# Patient Record
Sex: Male | Born: 1965 | Race: Black or African American | Hispanic: No | Marital: Single | Smoking: Current every day smoker
Health system: Southern US, Community
[De-identification: ages and names within clinical notes are randomized; demographics above are authoritative.]

## PROBLEM LIST (undated history)

## (undated) HISTORY — PX: EXPLORATORY LAPAROTOMY: SUR591

---
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
## 2015-01-09 ENCOUNTER — Encounter (HOSPITAL_COMMUNITY): Payer: Self-pay | Admitting: Emergency Medicine

## 2015-01-09 ENCOUNTER — Emergency Department (HOSPITAL_COMMUNITY)
Admission: EM | Admit: 2015-01-09 | Discharge: 2015-01-10 | Disposition: A | Discharge status: Home / Self Care | Payer: BLUE CROSS/BLUE SHIELD | Attending: Emergency Medicine | Admitting: Emergency Medicine

## 2015-01-09 DIAGNOSIS — R1110 Vomiting, unspecified: Secondary | ICD-10-CM | POA: Diagnosis not present

## 2015-01-09 DIAGNOSIS — Z720 Tobacco use: Secondary | ICD-10-CM | POA: Diagnosis not present

## 2015-01-09 DIAGNOSIS — R109 Unspecified abdominal pain: Secondary | ICD-10-CM

## 2015-01-09 DIAGNOSIS — R112 Nausea with vomiting, unspecified: Secondary | ICD-10-CM | POA: Diagnosis not present

## 2015-01-09 DIAGNOSIS — R1084 Generalized abdominal pain: Secondary | ICD-10-CM | POA: Diagnosis not present

## 2015-01-09 NOTE — ED Notes (Signed)
Pt c/o generalized abd pain with vomiting and chills.

## 2015-01-10 LAB — CBC WITH DIFFERENTIAL/PLATELET
Basophils Absolute: 0 10*3/uL (ref 0.0–0.1)
Basophils Relative: 0 % (ref 0–1)
EOS ABS: 0.1 10*3/uL (ref 0.0–0.7)
Eosinophils Relative: 1 % (ref 0–5)
HEMATOCRIT: 44.1 % (ref 39.0–52.0)
Hemoglobin: 15.1 g/dL (ref 13.0–17.0)
Lymphocytes Relative: 18 % (ref 12–46)
Lymphs Abs: 1.8 10*3/uL (ref 0.7–4.0)
MCH: 32.7 pg (ref 26.0–34.0)
MCHC: 34.2 g/dL (ref 30.0–36.0)
MCV: 95.5 fL (ref 78.0–100.0)
MONO ABS: 0.6 10*3/uL (ref 0.1–1.0)
Monocytes Relative: 6 % (ref 3–12)
Neutro Abs: 7.6 10*3/uL (ref 1.7–7.7)
Neutrophils Relative %: 75 % (ref 43–77)
Platelets: 270 10*3/uL (ref 150–400)
RBC: 4.62 MIL/uL (ref 4.22–5.81)
RDW: 14.3 % (ref 11.5–15.5)
WBC: 10.2 10*3/uL (ref 4.0–10.5)

## 2015-01-10 LAB — COMPREHENSIVE METABOLIC PANEL
ALBUMIN: 4.6 g/dL (ref 3.5–5.2)
ALK PHOS: 73 U/L (ref 39–117)
ALT: 29 U/L (ref 0–53)
ANION GAP: 10 (ref 5–15)
AST: 37 U/L (ref 0–37)
BILIRUBIN TOTAL: 0.8 mg/dL (ref 0.3–1.2)
BUN: 9 mg/dL (ref 6–23)
CO2: 27 mmol/L (ref 19–32)
Calcium: 9.9 mg/dL (ref 8.4–10.5)
Chloride: 99 mmol/L (ref 96–112)
Creatinine, Ser: 1.07 mg/dL (ref 0.50–1.35)
GFR, EST NON AFRICAN AMERICAN: 82 mL/min — AB (ref >90)
GLUCOSE: 191 mg/dL — AB (ref 70–99)
POTASSIUM: 3.7 mmol/L (ref 3.5–5.1)
Sodium: 136 mmol/L (ref 135–145)
Total Protein: 9 g/dL — ABNORMAL HIGH (ref 6.0–8.3)

## 2015-01-10 LAB — LIPASE, BLOOD: LIPASE: 19 U/L (ref 11–59)

## 2015-01-10 MED ORDER — SODIUM CHLORIDE 0.9 % IV SOLN: 1000.0000 mL | INTRAVENOUS | Status: DC

## 2015-01-10 MED ORDER — SODIUM CHLORIDE 0.9 % IV SOLN
1000.0000 mL | Freq: Once | INTRAVENOUS | Status: AC
  Administered 2015-01-10: 1000 mL via INTRAVENOUS

## 2015-01-10 MED ORDER — ONDANSETRON HCL 4 MG/2ML IJ SOLN
4.0000 mg | Freq: Once | INTRAMUSCULAR | Status: AC
  Administered 2015-01-10: 4 mg via INTRAVENOUS
  Filled 2015-01-10: qty 2

## 2015-01-10 MED ORDER — FENTANYL CITRATE 0.05 MG/ML IJ SOLN
25.0000 ug | Freq: Once | INTRAMUSCULAR | Status: AC
  Administered 2015-01-10: 25 ug via INTRAVENOUS
  Filled 2015-01-10: qty 2

## 2015-01-10 MED ORDER — ONDANSETRON HCL 4 MG PO TABS: 4.0000 mg | ORAL_TABLET | Freq: Three times a day (TID) | ORAL | Status: DC | PRN

## 2015-01-10 NOTE — ED Notes (Signed)
No pain at this time. Stomach sore from vomiting earlier.

## 2015-01-10 NOTE — ED Notes (Signed)
Pt reporting improvement in pain and nausea.

## 2015-01-10 NOTE — Discharge Instructions (Signed)
Drink plenty of fluids (clear liquids) the next 12-24 hours then start a bland diet (toast, crackers,jello, campbells soup).  Use the zofran for nausea or vomiting. Take imodium OTC for diarrhea if needed. Avoid mild products until the diarrhea is gone. Recheck if you get worse again.

## 2015-01-10 NOTE — ED Provider Notes (Signed)
CSN: [id number]     Arrival date & time 01/09/15  2315 History   First MD Initiated Contact with Patient 01/10/15 0108     Chief Complaint  Patient presents with  . Abdominal Pain     (Consider location/radiation/quality/duration/timing/severity/associated sxs/prior Treatment) HPI  Patient states it and he ate a chicken and positive dish  that he has never eaten before. He reports about 30 minutes later he started getting some intermittent abdominal pain. He states it's around the umbilicus. He has had vomiting 3 including 10 minutes prior to me evaluating him. He denies any diarrhea. He states his abdominal pain is improved he just now has diffuse soreness. He denies feeling dizzy or lightheaded. He does report several coworkers have had GI symptoms in the last week.  PCP none  History reviewed. No pertinent past medical history. History reviewed. No pertinent past surgical history. No family history on file. History  Substance Use Topics  . Smoking status: Current Every Day Smoker    Types: Cigarettes  . Smokeless tobacco: Not on file  . Alcohol Use: Yes  employed Smokes 1 ppd  Review of Systems  All other systems reviewed and are negative.     Allergies  Review of patient's allergies indicates no known allergies.  Home Medications   Prior to Admission medications   Medication Sig Start Date End Date Taking? Authorizing Provider  ondansetron (ZOFRAN) 4 MG tablet Take 1 tablet (4 mg total) by mouth every 8 (eight) hours as needed for nausea or vomiting. 01/10/15   Ward GivensIva L Elaysha Bevard, MD   BP 124/96 mmHg  Pulse 104  Temp(Src) 98.4 F (36.9 C)  Resp 18  Ht 5\' 6"  (1.676 m)  Wt 135 lb (61.236 kg)  BMI 21.80 kg/m2  SpO2 97%  Vital signs normal except for tachycardia  Physical Exam  Constitutional: He is oriented to person, place, and time. He appears well-developed and well-nourished.  Non-toxic appearance. He does not appear ill. No distress.  HENT:  Head: Normocephalic  and atraumatic.  Right Ear: External ear normal.  Left Ear: External ear normal.  Nose: Nose normal. No mucosal edema or rhinorrhea.  Mouth/Throat: Oropharynx is clear and moist and mucous membranes are normal. No dental abscesses or uvula swelling.  Eyes: Conjunctivae and EOM are normal. Pupils are equal, round, and reactive to light.  Neck: Normal range of motion and full passive range of motion without pain. Neck supple.  Cardiovascular: Normal rate, regular rhythm and normal heart sounds.  Exam reveals no gallop and no friction rub.   No murmur heard. Pulmonary/Chest: Effort normal and breath sounds normal. No respiratory distress. He has no wheezes. He has no rhonchi. He has no rales. He exhibits no tenderness and no crepitus.  Abdominal: Soft. Normal appearance and bowel sounds are normal. He exhibits no distension. There is generalized tenderness. There is no rebound and no guarding.  Musculoskeletal: Normal range of motion. He exhibits no edema or tenderness.  Moves all extremities well.   Neurological: He is alert and oriented to person, place, and time. He has normal strength. No cranial nerve deficit.  Skin: Skin is warm, dry and intact. No rash noted. No erythema. No pallor.  Psychiatric: He has a normal mood and affect. His speech is normal and behavior is normal. His mood appears not anxious.  Nursing note and vitals reviewed.   ED Course  Procedures (including critical care time)  Medications  0.9 %  sodium chloride infusion (0 mLs Intravenous Stopped  01/10/15 0417)    Followed by  0.9 %  sodium chloride infusion (1,000 mLs Intravenous New Bag/Given 01/10/15 0419)    Followed by  0.9 %  sodium chloride infusion (not administered)  ondansetron (ZOFRAN) injection 4 mg (4 mg Intravenous Given 01/10/15 0233)  fentaNYL (SUBLIMAZE) injection 25 mcg (25 mcg Intravenous Given 01/10/15 0233)   Patient was given IV fluids, IV nausea and pain medicine.  Patient was rechecked at 5 AM.  He reports he starting to have some urinary output. He states he's feeling better although his abdomen is sore from the vomiting. He is agreeable to trying to drink IV fluids.  Recheck 06:00 he was able to drink fluids without vomiting or feeling worse. Still c/o abdominal soreness. Feels ready to go home.    Labs Review Results for orders placed or performed during the hospital encounter of 01/09/15  CBC with Differential  Result Value Ref Range   WBC 10.2 4.0 - 10.5 K/uL   RBC 4.62 4.22 - 5.81 MIL/uL   Hemoglobin 15.1 13.0 - 17.0 g/dL   HCT 16.1 09.6 - 04.5 %   MCV 95.5 78.0 - 100.0 fL   MCH 32.7 26.0 - 34.0 pg   MCHC 34.2 30.0 - 36.0 g/dL   RDW 40.9 81.1 - 91.4 %   Platelets 270 150 - 400 K/uL   Neutrophils Relative % 75 43 - 77 %   Neutro Abs 7.6 1.7 - 7.7 K/uL   Lymphocytes Relative 18 12 - 46 %   Lymphs Abs 1.8 0.7 - 4.0 K/uL   Monocytes Relative 6 3 - 12 %   Monocytes Absolute 0.6 0.1 - 1.0 K/uL   Eosinophils Relative 1 0 - 5 %   Eosinophils Absolute 0.1 0.0 - 0.7 K/uL   Basophils Relative 0 0 - 1 %   Basophils Absolute 0.0 0.0 - 0.1 K/uL  Comprehensive metabolic panel  Result Value Ref Range   Sodium 136 135 - 145 mmol/L   Potassium 3.7 3.5 - 5.1 mmol/L   Chloride 99 96 - 112 mmol/L   CO2 27 19 - 32 mmol/L   Glucose, Bld 191 (H) 70 - 99 mg/dL   BUN 9 6 - 23 mg/dL   Creatinine, Ser 7.82 0.50 - 1.35 mg/dL   Calcium 9.9 8.4 - 95.6 mg/dL   Total Protein 9.0 (H) 6.0 - 8.3 g/dL   Albumin 4.6 3.5 - 5.2 g/dL   AST 37 0 - 37 U/L   ALT 29 0 - 53 U/L   Alkaline Phosphatase 73 39 - 117 U/L   Total Bilirubin 0.8 0.3 - 1.2 mg/dL   GFR calc non Af Amer 82 (L) >90 mL/min   GFR calc Af Amer >90 >90 mL/min   Anion gap 10 5 - 15  Lipase, blood  Result Value Ref Range   Lipase 19 11 - 59 U/L   Laboratory interpretation all normal except hyperglycemia     Imaging Review No results found.   EKG Interpretation None      MDM   Final diagnoses:  Abdominal pain,  unspecified abdominal location  Nausea and vomiting, vomiting of unspecified type    New Prescriptions   ONDANSETRON (ZOFRAN) 4 MG TABLET    Take 1 tablet (4 mg total) by mouth every 8 (eight) hours as needed for nausea or vomiting.    Plan discharge  Devoria Albe, MD, Franz Dell, MD 01/10/15 [phone]

## 2015-09-18 ENCOUNTER — Encounter: Payer: Self-pay | Admitting: *Deleted

## 2015-09-18 ENCOUNTER — Observation Stay
Admission: EM | Admit: 2015-09-18 | Discharge: 2015-09-20 | Disposition: A | Discharge status: Home / Self Care | Payer: BLUE CROSS/BLUE SHIELD | Attending: General Surgery | Admitting: General Surgery

## 2015-09-18 ENCOUNTER — Emergency Department: Payer: BLUE CROSS/BLUE SHIELD

## 2015-09-18 DIAGNOSIS — K565 Intestinal adhesions [bands], unspecified as to partial versus complete obstruction: Secondary | ICD-10-CM

## 2015-09-18 DIAGNOSIS — F17210 Nicotine dependence, cigarettes, uncomplicated: Secondary | ICD-10-CM | POA: Diagnosis not present

## 2015-09-18 DIAGNOSIS — K5652 Intestinal adhesions [bands] with complete obstruction: Secondary | ICD-10-CM | POA: Diagnosis present

## 2015-09-18 DIAGNOSIS — R102 Pelvic and perineal pain: Secondary | ICD-10-CM | POA: Diagnosis not present

## 2015-09-18 DIAGNOSIS — K5660 Unspecified intestinal obstruction: Secondary | ICD-10-CM | POA: Diagnosis present

## 2015-09-18 DIAGNOSIS — R112 Nausea with vomiting, unspecified: Secondary | ICD-10-CM | POA: Diagnosis not present

## 2015-09-18 DIAGNOSIS — N400 Enlarged prostate without lower urinary tract symptoms: Secondary | ICD-10-CM | POA: Insufficient documentation

## 2015-09-18 DIAGNOSIS — K760 Fatty (change of) liver, not elsewhere classified: Secondary | ICD-10-CM | POA: Diagnosis not present

## 2015-09-18 LAB — URINALYSIS COMPLETE WITH MICROSCOPIC (ARMC ONLY)
BACTERIA UA: NONE SEEN
Bilirubin Urine: NEGATIVE
Glucose, UA: NEGATIVE mg/dL
HGB URINE DIPSTICK: NEGATIVE
LEUKOCYTES UA: NEGATIVE
Nitrite: NEGATIVE
PROTEIN: 30 mg/dL — AB
SPECIFIC GRAVITY, URINE: 1.02 (ref 1.005–1.030)
SQUAMOUS EPITHELIAL / LPF: NONE SEEN
pH: 7 (ref 5.0–8.0)

## 2015-09-18 LAB — COMPREHENSIVE METABOLIC PANEL
ALBUMIN: 4.3 g/dL (ref 3.5–5.0)
ALT: 43 U/L (ref 17–63)
ANION GAP: 12 (ref 5–15)
AST: 74 U/L — AB (ref 15–41)
Alkaline Phosphatase: 85 U/L (ref 38–126)
BILIRUBIN TOTAL: 0.7 mg/dL (ref 0.3–1.2)
BUN: 11 mg/dL (ref 6–20)
CO2: 25 mmol/L (ref 22–32)
Calcium: 9.7 mg/dL (ref 8.9–10.3)
Chloride: 101 mmol/L (ref 101–111)
Creatinine, Ser: 1.03 mg/dL (ref 0.61–1.24)
GFR calc Af Amer: >60 mL/min (ref >60)
GFR calc non Af Amer: >60 mL/min (ref >60)
GLUCOSE: 126 mg/dL — AB (ref 65–99)
POTASSIUM: 3.8 mmol/L (ref 3.5–5.1)
Sodium: 138 mmol/L (ref 135–145)
TOTAL PROTEIN: 7.9 g/dL (ref 6.5–8.1)

## 2015-09-18 LAB — CBC
HCT: 41 % (ref 40.0–52.0)
HEMOGLOBIN: 13.9 g/dL (ref 13.0–18.0)
MCH: 32.3 pg (ref 26.0–34.0)
MCHC: 33.8 g/dL (ref 32.0–36.0)
MCV: 95.6 fL (ref 80.0–100.0)
Platelets: 222 10*3/uL (ref 150–440)
RBC: 4.29 MIL/uL — ABNORMAL LOW (ref 4.40–5.90)
RDW: 13.3 % (ref 11.5–14.5)
WBC: 8.2 10*3/uL (ref 3.8–10.6)

## 2015-09-18 LAB — LIPASE, BLOOD: LIPASE: 14 U/L (ref 11–51)

## 2015-09-18 MED ORDER — ONDANSETRON HCL 4 MG PO TABS: 4.0000 mg | ORAL_TABLET | Freq: Four times a day (QID) | ORAL | Status: DC | PRN

## 2015-09-18 MED ORDER — MORPHINE SULFATE (PF) 2 MG/ML IV SOLN
2.0000 mg | INTRAVENOUS | Status: DC | PRN
Start: 2015-09-18 — End: 2015-09-20
  Administered 2015-09-19 – 2015-09-20 (×12): 2 mg via INTRAVENOUS
  Filled 2015-09-18 (×13): qty 1

## 2015-09-18 MED ORDER — MORPHINE SULFATE (PF) 4 MG/ML IV SOLN
INTRAVENOUS | Status: AC
  Administered 2015-09-18: 4 mg via INTRAVENOUS
  Filled 2015-09-18: qty 1

## 2015-09-18 MED ORDER — MORPHINE SULFATE (PF) 4 MG/ML IV SOLN
4.0000 mg | Freq: Once | INTRAVENOUS | Status: AC
  Administered 2015-09-18: 4 mg via INTRAVENOUS
  Filled 2015-09-18: qty 1

## 2015-09-18 MED ORDER — IOHEXOL 300 MG/ML  SOLN
100.0000 mL | Freq: Once | INTRAMUSCULAR | Status: AC | PRN
  Administered 2015-09-18: 100 mL via INTRAVENOUS
  Filled 2015-09-18: qty 100

## 2015-09-18 MED ORDER — ONDANSETRON HCL 4 MG/2ML IJ SOLN
INTRAMUSCULAR | Status: AC
  Administered 2015-09-18: 4 mg via INTRAVENOUS
  Filled 2015-09-18: qty 2

## 2015-09-18 MED ORDER — ACETAMINOPHEN 650 MG RE SUPP: 650.0000 mg | Freq: Four times a day (QID) | RECTAL | Status: DC | PRN

## 2015-09-18 MED ORDER — ENOXAPARIN SODIUM 40 MG/0.4ML ~~LOC~~ SOLN
40.0000 mg | SUBCUTANEOUS | Status: DC
  Administered 2015-09-19: 40 mg via SUBCUTANEOUS
  Filled 2015-09-18: qty 0.4

## 2015-09-18 MED ORDER — ONDANSETRON HCL 4 MG/2ML IJ SOLN
4.0000 mg | Freq: Once | INTRAMUSCULAR | Status: AC
  Administered 2015-09-18: 4 mg via INTRAVENOUS

## 2015-09-18 MED ORDER — IOHEXOL 240 MG/ML SOLN
25.0000 mL | Freq: Once | INTRAMUSCULAR | Status: AC | PRN
  Administered 2015-09-18: 25 mL via ORAL
  Filled 2015-09-18: qty 25

## 2015-09-18 MED ORDER — SODIUM CHLORIDE 0.9 % IV BOLUS (SEPSIS)
1000.0000 mL | INTRAVENOUS | Status: AC
Start: 2015-09-18 — End: 2015-09-18
  Administered 2015-09-18: 1000 mL via INTRAVENOUS

## 2015-09-18 MED ORDER — DEXTROSE IN LACTATED RINGERS 5 % IV SOLN
INTRAVENOUS | Status: DC
  Administered 2015-09-18 – 2015-09-20 (×4): via INTRAVENOUS

## 2015-09-18 MED ORDER — ONDANSETRON HCL 4 MG/2ML IJ SOLN: 4.0000 mg | Freq: Four times a day (QID) | INTRAMUSCULAR | Status: DC | PRN

## 2015-09-18 MED ORDER — MORPHINE SULFATE (PF) 4 MG/ML IV SOLN
4.0000 mg | Freq: Once | INTRAVENOUS | Status: AC
  Administered 2015-09-18: 4 mg via INTRAVENOUS

## 2015-09-18 MED ORDER — ONDANSETRON HCL 4 MG/2ML IJ SOLN
4.0000 mg | Freq: Once | INTRAMUSCULAR | Status: AC
  Administered 2015-09-18: 4 mg via INTRAVENOUS
  Filled 2015-09-18: qty 2

## 2015-09-18 MED ORDER — ACETAMINOPHEN 325 MG PO TABS: 650.0000 mg | ORAL_TABLET | Freq: Four times a day (QID) | ORAL | Status: DC | PRN

## 2015-09-18 NOTE — ED Notes (Signed)
Pt reports generalized abdominal pain and nausea since 11am. No vomiting/diarrhea.

## 2015-09-18 NOTE — ED Notes (Signed)
RN checked on pt at this time, informed pt for the need to attempt again and pt refused. MD York CeriseForbach made aware, verbalized [facility]. MD Colette RibasByrd paged at this time with no response. Will attempt again momentarily.

## 2015-09-18 NOTE — ED Notes (Signed)
RN spoke to MD Colette RibasByrd verbalized to inform pt of risks v benefit of NG tube and if pt refuses, then not to attempt again. Pt made aware and verbalized understanding. Pt still refusing. No acute distress noted.

## 2015-09-18 NOTE — ED Notes (Signed)
MD Forbach at bedside. 
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
## 2015-09-18 NOTE — ED Notes (Signed)
MD Colette RibasByrd at bedside at this time

## 2015-09-18 NOTE — ED Notes (Signed)
MD forbach at bedside

## 2015-09-18 NOTE — ED Notes (Signed)
2 RN attempted to place NG tube at this time, NG tube in place and finished when pt ripped tube out stating, "No I cannot tolerate this, i refuse, I cant take it" Pt then proceeded to rip out tube while RN was securing to nostril with tape. RN stated to pt to take a min and relax RN will check on pt momentarily to try again.
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
## 2015-09-19 ENCOUNTER — Observation Stay: Payer: BLUE CROSS/BLUE SHIELD

## 2015-09-19 LAB — BASIC METABOLIC PANEL
ANION GAP: 9 (ref 5–15)
BUN: 9 mg/dL (ref 6–20)
CHLORIDE: 98 mmol/L — AB (ref 101–111)
CO2: 31 mmol/L (ref 22–32)
Calcium: 9.4 mg/dL (ref 8.9–10.3)
Creatinine, Ser: 0.98 mg/dL (ref 0.61–1.24)
GFR calc Af Amer: >60 mL/min (ref >60)
GLUCOSE: 124 mg/dL — AB (ref 65–99)
POTASSIUM: 3.4 mmol/L — AB (ref 3.5–5.1)
Sodium: 138 mmol/L (ref 135–145)

## 2015-09-19 LAB — CBC
HEMATOCRIT: 40.2 % (ref 40.0–52.0)
HEMOGLOBIN: 13.8 g/dL (ref 13.0–18.0)
MCH: 32.6 pg (ref 26.0–34.0)
MCHC: 34.3 g/dL (ref 32.0–36.0)
MCV: 94.9 fL (ref 80.0–100.0)
Platelets: 220 10*3/uL (ref 150–440)
RBC: 4.24 MIL/uL — ABNORMAL LOW (ref 4.40–5.90)
RDW: 13.5 % (ref 11.5–14.5)
WBC: 6.6 10*3/uL (ref 3.8–10.6)
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
## 2015-09-20 ENCOUNTER — Telehealth: Payer: Self-pay | Admitting: General Surgery

## 2015-09-20 MED ORDER — ONDANSETRON 4 MG PO TBDP: 4.0000 mg | ORAL_TABLET | Freq: Three times a day (TID) | ORAL | Status: DC | PRN

## 2015-09-20 MED ORDER — OXYCODONE-ACETAMINOPHEN 5-325 MG PO TABS
1.0000 | ORAL_TABLET | ORAL | Status: DC | PRN
  Administered 2015-09-20: 2 via ORAL
  Filled 2015-09-20: qty 2

## 2015-09-20 MED ORDER — OXYCODONE-ACETAMINOPHEN 5-325 MG PO TABS: 1.0000 | ORAL_TABLET | ORAL | Status: DC | PRN

## 2015-09-20 NOTE — Discharge Instructions (Signed)
Small Bowel Obstruction °A small bowel obstruction is a blockage in the small bowel. The small bowel, which is also called the small intestine, is a long, slender tube that connects the stomach to the colon. When a person eats and drinks, food and fluids go from the stomach to the small bowel. This is where most of the nutrients in the food and fluids are absorbed. °A small bowel obstruction will prevent food and fluids from passing through the small bowel as they normally do during digestion. The small bowel can become partially or completely blocked. This can cause symptoms such as abdominal pain, vomiting, and bloating. If this condition is not treated, it can be dangerous because the small bowel could rupture. °CAUSES °Common causes of this condition include: °· Scar tissue from previous surgery or radiation treatment. °· Recent surgery. This may cause the movements of the bowel to slow down and cause food to block the intestine. °· Hernias. °· Inflammatory bowel disease (colitis). °· Twisting of the bowel (volvulus). °· Tumors. °· A foreign body. °· Slipping of a part of the bowel into another part (intussusception). °SYMPTOMS °Symptoms of this condition include: °· Abdominal pain. This may be dull cramps or sharp pain. It may occur in one area, or it may be present in the entire abdomen. Pain can range from mild to severe, depending on the degree of obstruction. °· Nausea and vomiting. Vomit may be greenish or a yellow bile color. °· Abdominal bloating. °· Constipation. °· Lack of passing gas. °· Frequent belching. °· Diarrhea. This may occur if the obstruction is partial and runny stool is able to leak around the obstruction. °DIAGNOSIS °This condition may be diagnosed based on a physical exam, medical history, and X-rays of the abdomen. You may also have other tests, such as a CT scan of the abdomen and pelvis. °TREATMENT °Treatment for this condition depends on the cause and severity of the problem.  Treatment options may include: °· Bed rest along with fluids and pain medicines that are given through an IV tube inserted into one of your veins. Sometimes, this is all that is needed for the obstruction to improve. °· Following a simple diet. In some cases, a clear liquid diet may be required for several days. This allows the bowel to rest. °· Placement of a small tube (nasogastric tube) into the stomach. When the bowel is blocked, it usually swells up like a balloon that is filled with air and fluids. The air and fluids may be removed by suction through the nasogastric tube. This can help with pain, discomfort, and nausea. It can also help the obstruction to clear up faster. °· Surgery. This may be required if other treatments do not work. Bowel obstruction from a hernia may require early surgery and can be an emergency procedure. Surgery may also be required for scar tissue that causes frequent or severe obstructions. °HOME CARE INSTRUCTIONS °· Get plenty of rest. °· Follow instructions from your health care provider about eating restrictions. You may need to avoid solid foods and consume only clear liquids until your condition improves. °· Take over-the-counter and prescription medicines only as told by your health care provider. °· Keep all follow-up visits as told by your health care provider. This is important. °SEEK MEDICAL CARE IF: °· You have a fever. °· You have chills. °SEEK IMMEDIATE MEDICAL CARE IF: °· You have increased pain or cramping. °· You vomit blood. °· You have uncontrolled vomiting or nausea. °· You cannot drink   fluids because of vomiting or pain. °· You develop confusion. °· You begin feeling very dry or thirsty (dehydrated). °· You have severe bloating. °· You feel extremely weak or you faint. °  °This information is not intended to replace advice given to you by your health care provider. Make sure you discuss any questions you have with your health care provider. °  °Document Released:  01/26/2006 Document Revised: 07/31/2015 Document Reviewed: 01/03/2015 °Elsevier Interactive Patient Education ©2016 [employer] ° ° °

## 2015-09-20 NOTE — Progress Notes (Signed)
Pt VSS Pt given morphine along with percocet for pain. Pt had no complaints of nausea. Tolerating diet. Pt resting well. Pt received discharge orders. Instructions given to pt with all questions answered. IV removed with dressing dry and intact. Pt refused wheelchair at discharge and requested to walk out.

## 2015-09-20 NOTE — Discharge Summary (Signed)
Patient ID: Thomas Franklin MRN: [id number] DOB/AGE: 1966/02/14 49 y.o.  Admit date: 09/18/2015 Discharge date: 09/20/2015  Discharge Diagnoses:  Small Bowel obstrcution  Procedures Performed: n/a  Discharged Condition: good  Hospital Course: Admitted from ER with diagnosis of small bowel obstruction. Had resolution of symptoms with bowel rest. Able to tolerate a diet prior to discharge.  Discharge Orders: Discharge home; no activity restrictions; gradually increase diet. Call or return to ER should symptoms recur   Disposition: 01-Home or Self Care  Discharge Medications:   .  ondansetron (ZOFRAN) tablet 4 mg, 4 mg, Oral, Q6H PRN **OR** ondansetron (ZOFRAN) injection 4 mg, 4 mg, Intravenous, Q6H PRN, Natale LayMark Bird, MD .  oxyCODONE-acetaminophen (PERCOCET/ROXICET) 5-325 MG per tablet 1-2 tablet, 1-2 tablet, Oral, Q4H PRN, Thomas Frameharles Runa Whittingham, MD, 2 tablet at 09/20/15 1319  Follwup: Follow-up Information    Call [facility] [place].   Specialty:  General Surgery   Why:  As needed   Contact information:   [address] [place] [id number] [phone]      Signed: [name] 09/20/2015, 1:33 PM
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
## 2015-09-20 NOTE — Care Management (Signed)
Patient discharging home today. No home health orders. No further RNCM needs. Case closed.

## 2015-09-20 NOTE — Progress Notes (Signed)
Order given to Primary Nurse  From Dr. Tonita CongWoodham for pt to receive Reg Diet and Percocet 1-2 tabs q 4 hrs PRN.

## 2015-09-20 NOTE — Telephone Encounter (Signed)
Entered in error

## 2015-09-25 ENCOUNTER — Encounter: Payer: Self-pay | Admitting: General Surgery

## 2015-09-25 ENCOUNTER — Ambulatory Visit (INDEPENDENT_AMBULATORY_CARE_PROVIDER_SITE_OTHER): Payer: BLUE CROSS/BLUE SHIELD | Admitting: General Surgery

## 2015-09-25 VITALS — BP 119/76 | HR 98 | Temp 98.2°F | Ht 66.0 in | Wt 120.0 lb

## 2015-09-25 DIAGNOSIS — K565 Intestinal adhesions [bands], unspecified as to partial versus complete obstruction: Secondary | ICD-10-CM

## 2015-09-25 NOTE — Patient Instructions (Signed)
Please give a call if yo have any questions or concerns.
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
## 2015-09-30 ENCOUNTER — Telehealth: Payer: Self-pay

## 2015-09-30 NOTE — Telephone Encounter (Signed)
Called patient and was not able to leave him a voicemail. In case he calls, his disability form was filled out and faxed. Patient is to go back to work today 09/30/2015 with no restrictions.

## 2017-10-07 ENCOUNTER — Encounter: Payer: Self-pay | Admitting: *Deleted

## 2017-10-07 ENCOUNTER — Other Ambulatory Visit: Payer: Self-pay

## 2017-10-07 DIAGNOSIS — K5651 Intestinal adhesions [bands], with partial obstruction: Principal | ICD-10-CM | POA: Diagnosis present

## 2017-10-07 DIAGNOSIS — R739 Hyperglycemia, unspecified: Secondary | ICD-10-CM | POA: Diagnosis not present

## 2017-10-07 DIAGNOSIS — R000 Tachycardia, unspecified: Secondary | ICD-10-CM | POA: Diagnosis present

## 2017-10-07 DIAGNOSIS — E876 Hypokalemia: Secondary | ICD-10-CM | POA: Diagnosis not present

## 2017-10-07 DIAGNOSIS — F17210 Nicotine dependence, cigarettes, uncomplicated: Secondary | ICD-10-CM | POA: Diagnosis present

## 2017-10-07 DIAGNOSIS — Z5320 Procedure and treatment not carried out because of patient's decision for unspecified reasons: Secondary | ICD-10-CM | POA: Diagnosis not present

## 2017-10-07 LAB — CBC
HCT: 44.7 % (ref 40.0–52.0)
Hemoglobin: 15.1 g/dL (ref 13.0–18.0)
MCH: 31.9 pg (ref 26.0–34.0)
MCHC: 33.8 g/dL (ref 32.0–36.0)
MCV: 94.3 fL (ref 80.0–100.0)
PLATELETS: 261 10*3/uL (ref 150–440)
RBC: 4.74 MIL/uL (ref 4.40–5.90)
RDW: 14.1 % (ref 11.5–14.5)
WBC: 8.6 10*3/uL (ref 3.8–10.6)

## 2017-10-07 LAB — COMPREHENSIVE METABOLIC PANEL
ALK PHOS: 91 U/L (ref 38–126)
ALT: 22 U/L (ref 17–63)
ANION GAP: 15 (ref 5–15)
AST: 35 U/L (ref 15–41)
Albumin: 4.6 g/dL (ref 3.5–5.0)
BILIRUBIN TOTAL: 0.5 mg/dL (ref 0.3–1.2)
BUN: 15 mg/dL (ref 6–20)
CALCIUM: 9.8 mg/dL (ref 8.9–10.3)
CO2: 26 mmol/L (ref 22–32)
CREATININE: 0.97 mg/dL (ref 0.61–1.24)
Chloride: 98 mmol/L — ABNORMAL LOW (ref 101–111)
GFR calc Af Amer: >60 mL/min (ref >60)
GFR calc non Af Amer: >60 mL/min (ref >60)
GLUCOSE: 170 mg/dL — AB (ref 65–99)
Potassium: 3.5 mmol/L (ref 3.5–5.1)
Sodium: 139 mmol/L (ref 135–145)
TOTAL PROTEIN: 8.7 g/dL — AB (ref 6.5–8.1)

## 2017-10-07 LAB — LIPASE, BLOOD: Lipase: 20 U/L (ref 11–51)

## 2017-10-07 MED ORDER — ONDANSETRON 4 MG PO TBDP
4.0000 mg | ORAL_TABLET | Freq: Once | ORAL | Status: AC | PRN
  Administered 2017-10-07: 4 mg via ORAL
  Filled 2017-10-07: qty 1

## 2017-10-07 NOTE — ED Notes (Signed)
Pt unable to void at this time. 

## 2017-10-07 NOTE — ED Triage Notes (Signed)
Pt reports abd pain with vomiting x 1.  No diarrhea.  Sx began today.  Last etoh was yesterday.  Pt alert

## 2017-10-08 ENCOUNTER — Emergency Department: Payer: Self-pay

## 2017-10-08 ENCOUNTER — Inpatient Hospital Stay
Admission: EM | Admit: 2017-10-08 | Discharge: 2017-10-13 | DRG: 390 | Disposition: A | Discharge status: Home / Self Care | Payer: BLUE CROSS/BLUE SHIELD | Attending: General Surgery | Admitting: General Surgery

## 2017-10-08 ENCOUNTER — Other Ambulatory Visit: Payer: Self-pay

## 2017-10-08 ENCOUNTER — Encounter: Payer: Self-pay | Admitting: Emergency Medicine

## 2017-10-08 DIAGNOSIS — K56609 Unspecified intestinal obstruction, unspecified as to partial versus complete obstruction: Secondary | ICD-10-CM

## 2017-10-08 DIAGNOSIS — R109 Unspecified abdominal pain: Secondary | ICD-10-CM

## 2017-10-08 DIAGNOSIS — Z0189 Encounter for other specified special examinations: Secondary | ICD-10-CM

## 2017-10-08 LAB — COMPREHENSIVE METABOLIC PANEL
ALK PHOS: 74 U/L (ref 38–126)
ALT: 20 U/L (ref 17–63)
ANION GAP: 12 (ref 5–15)
AST: 30 U/L (ref 15–41)
Albumin: 4 g/dL (ref 3.5–5.0)
BILIRUBIN TOTAL: 1.1 mg/dL (ref 0.3–1.2)
BUN: 13 mg/dL (ref 6–20)
CALCIUM: 9.2 mg/dL (ref 8.9–10.3)
CO2: 30 mmol/L (ref 22–32)
Chloride: 99 mmol/L — ABNORMAL LOW (ref 101–111)
Creatinine, Ser: 0.94 mg/dL (ref 0.61–1.24)
Glucose, Bld: 136 mg/dL — ABNORMAL HIGH (ref 65–99)
Potassium: 3.4 mmol/L — ABNORMAL LOW (ref 3.5–5.1)
Sodium: 141 mmol/L (ref 135–145)
TOTAL PROTEIN: 8 g/dL (ref 6.5–8.1)

## 2017-10-08 LAB — CBC
HCT: 40.7 % (ref 40.0–52.0)
Hemoglobin: 13.9 g/dL (ref 13.0–18.0)
MCH: 31.7 pg (ref 26.0–34.0)
MCHC: 34.1 g/dL (ref 32.0–36.0)
MCV: 93.1 fL (ref 80.0–100.0)
PLATELETS: 268 10*3/uL (ref 150–440)
RBC: 4.38 MIL/uL — AB (ref 4.40–5.90)
RDW: 14.2 % (ref 11.5–14.5)
WBC: 9.3 10*3/uL (ref 3.8–10.6)

## 2017-10-08 LAB — URINALYSIS, COMPLETE (UACMP) WITH MICROSCOPIC
Bacteria, UA: NONE SEEN
Bilirubin Urine: NEGATIVE
GLUCOSE, UA: NEGATIVE mg/dL
Ketones, ur: 20 mg/dL — AB
Leukocytes, UA: NEGATIVE
Nitrite: NEGATIVE
PH: 6 (ref 5.0–8.0)
PROTEIN: 100 mg/dL — AB
SPECIFIC GRAVITY, URINE: 1.032 — AB (ref 1.005–1.030)

## 2017-10-08 LAB — PROTIME-INR
INR: 0.97
PROTHROMBIN TIME: 12.8 s (ref 11.4–15.2)

## 2017-10-08 LAB — APTT: aPTT: 28 seconds (ref 24–36)

## 2017-10-08 LAB — MAGNESIUM: MAGNESIUM: 2.2 mg/dL (ref 1.7–2.4)

## 2017-10-08 LAB — PHOSPHORUS: Phosphorus: 3.8 mg/dL (ref 2.5–4.6)

## 2017-10-08 MED ORDER — KETOROLAC TROMETHAMINE 30 MG/ML IJ SOLN
30.0000 mg | Freq: Four times a day (QID) | INTRAMUSCULAR | Status: DC | PRN
  Administered 2017-10-08: 30 mg via INTRAVENOUS
  Filled 2017-10-08: qty 1

## 2017-10-08 MED ORDER — IOPAMIDOL (ISOVUE-300) INJECTION 61%
100.0000 mL | Freq: Once | INTRAVENOUS | Status: AC | PRN
  Administered 2017-10-08: 100 mL via INTRAVENOUS

## 2017-10-08 MED ORDER — DIPHENHYDRAMINE HCL 25 MG PO CAPS: 25.0000 mg | ORAL_CAPSULE | Freq: Four times a day (QID) | ORAL | Status: DC | PRN

## 2017-10-08 MED ORDER — ENOXAPARIN SODIUM 40 MG/0.4ML ~~LOC~~ SOLN
40.0000 mg | SUBCUTANEOUS | Status: DC
Start: 2017-10-08 — End: 2017-10-13
  Administered 2017-10-08 – 2017-10-11 (×4): 40 mg via SUBCUTANEOUS
  Filled 2017-10-08 (×6): qty 0.4

## 2017-10-08 MED ORDER — MORPHINE SULFATE (PF) 4 MG/ML IV SOLN
4.0000 mg | INTRAVENOUS | Status: DC | PRN
  Administered 2017-10-08: 4 mg via INTRAVENOUS
  Filled 2017-10-08: qty 1

## 2017-10-08 MED ORDER — DEXTROSE IN LACTATED RINGERS 5 % IV SOLN
INTRAVENOUS | Status: DC
  Administered 2017-10-08 – 2017-10-09 (×3): via INTRAVENOUS

## 2017-10-08 MED ORDER — DIPHENHYDRAMINE HCL 50 MG/ML IJ SOLN
25.0000 mg | Freq: Four times a day (QID) | INTRAMUSCULAR | Status: DC | PRN
  Administered 2017-10-09 – 2017-10-12 (×3): 25 mg via INTRAVENOUS
  Filled 2017-10-08 (×3): qty 1

## 2017-10-08 MED ORDER — PANTOPRAZOLE SODIUM 40 MG IV SOLR
40.0000 mg | Freq: Every day | INTRAVENOUS | Status: DC
  Administered 2017-10-08 – 2017-10-12 (×5): 40 mg via INTRAVENOUS
  Filled 2017-10-08 (×5): qty 40

## 2017-10-08 MED ORDER — SUCRALFATE 1 G PO TABS
1.0000 g | ORAL_TABLET | Freq: Once | ORAL | Status: AC
  Administered 2017-10-08: 1 g via ORAL
  Filled 2017-10-08: qty 1

## 2017-10-08 MED ORDER — NICOTINE 14 MG/24HR TD PT24
14.0000 mg | MEDICATED_PATCH | Freq: Every day | TRANSDERMAL | Status: DC
  Administered 2017-10-08 – 2017-10-11 (×4): 14 mg via TRANSDERMAL
  Filled 2017-10-08 (×5): qty 1

## 2017-10-08 MED ORDER — GI COCKTAIL ~~LOC~~
30.0000 mL | Freq: Once | ORAL | Status: AC
  Administered 2017-10-08: 30 mL via ORAL
  Filled 2017-10-08: qty 30

## 2017-10-08 MED ORDER — ONDANSETRON 4 MG PO TBDP: 4.0000 mg | ORAL_TABLET | Freq: Four times a day (QID) | ORAL | Status: DC | PRN

## 2017-10-08 MED ORDER — HYDRALAZINE HCL 20 MG/ML IJ SOLN: 10.0000 mg | INTRAMUSCULAR | Status: DC | PRN

## 2017-10-08 MED ORDER — MORPHINE SULFATE (PF) 2 MG/ML IV SOLN
2.0000 mg | INTRAVENOUS | Status: DC | PRN
  Administered 2017-10-09 – 2017-10-11 (×4): 2 mg via INTRAVENOUS
  Filled 2017-10-08 (×4): qty 1

## 2017-10-08 MED ORDER — ONDANSETRON HCL 4 MG/2ML IJ SOLN
4.0000 mg | Freq: Four times a day (QID) | INTRAMUSCULAR | Status: DC | PRN
  Administered 2017-10-09: 4 mg via INTRAVENOUS
  Filled 2017-10-08: qty 2

## 2017-10-08 MED ORDER — ONDANSETRON HCL 4 MG/2ML IJ SOLN
4.0000 mg | Freq: Once | INTRAMUSCULAR | Status: AC
  Administered 2017-10-08: 4 mg via INTRAVENOUS
  Filled 2017-10-08: qty 2

## 2017-10-08 MED ORDER — SODIUM CHLORIDE 0.9 % IV BOLUS (SEPSIS)
1000.0000 mL | Freq: Once | INTRAVENOUS | Status: AC
Start: 2017-10-08 — End: 2017-10-08
  Administered 2017-10-08: 1000 mL via INTRAVENOUS

## 2017-10-08 NOTE — Progress Notes (Addendum)
Initial Nutrition Assessment  DOCUMENTATION CODES:   Non-severe (moderate) malnutrition in context of chronic illness  INTERVENTION:   Recommend IV thiamine and folic acid supplementation as pt with h/o etoh abuse. Will order MVI when diet advanced.   RD will monitor for diet advancement vs the need for nutrition support  NUTRITION DIAGNOSIS:   Moderate Malnutrition related to chronic illness(etoh abuse, chronic SBOs) as evidenced by moderate muscle depletions over entire body and mild fat depletions in orbital and buccal regions, moderate fat depletions in arms and chest.  GOAL:   Patient will meet greater than or equal to 90% of their needs  MONITOR:   PO intake, Supplement acceptance, Labs, Weight trends, I & O's  REASON FOR ASSESSMENT:   Other (Comment)(low BMI)    ASSESSMENT:   51 year old male with a recurrent small bowel obstruction.    Met with pt in room today. Pt reports good appetite and oral intake up until yesterday when he started having abdominal pain and nausea. Pt is currently NPO r/t SBO. Pt reports that he has lost 18lbs(14%) but he is unsure as to the time frame over which he lost the weight. He reports his UBW is 130lbs. There is no recent weight history in chart. RD will monitor for diet advancement vs nutrition support. Pt with hypokalemia; monitor and supplement as needed per MD discretion.     Medications reviewed and include: lovenox, nicotine, protonix, LRS w/ 5% dextrose _0 /hr, morphine  Labs reviewed: K 3.4(L), Cl 99(L) cbgs- 170, 136 x 24hrs  Nutrition-Focused physical exam completed. Findings are moderate muscle depletions over entire body and mild fat depletions in orbital and buccal regions, moderate fat depletions in arms and chest, and no edema.   Diet Order:  Diet NPO time specified Except for: Ice Chips  EDUCATION NEEDS:   No education needs have been identified at this time  Skin: Reviewed RN Assessment  Last BM:   11/15  Height:   Ht Readings from Last 1 Encounters:  10/08/17 _1  (1.676 m)    Weight:   Wt Readings from Last 1 Encounters:  10/08/17 112 lb 4.8 oz (50.9 kg)    Ideal Body Weight:  64.5 kg  BMI:  Body mass index is 18.13 kg/m.  Estimated Nutritional Needs:   Kcal:  1700-2000kcal/day   Protein:  76-87g/day   Fluid:  >1.7L/day   Koleen Distance MS, RD, LDN Pager #[phone] After Hours Pager: [phone]

## 2017-10-08 NOTE — ED Notes (Signed)
Surgeon at bedside.  

## 2017-10-08 NOTE — ED Notes (Signed)
Pt transported to US

## 2017-10-08 NOTE — Progress Notes (Signed)
10/08/2017  Subjective: No acute events.  Patient admitted this morning with small bowel obstruction.  Denies any nausea currently and reports feeling better.  No bowel function yet.  Vital signs: Temp:  [98.3 F (36.8 C)-98.8 F (37.1 C)] 98.3 F (36.8 C) (11/16 [id number]) Pulse Rate:  [90-129] 104 (11/16 1135) Resp:  [16-20] 16 (11/16 0543) BP: (103-150)/(58-95) 113/72 (11/16 1135) SpO2:  [90 %-98 %] 95 % (11/16 1135) Weight:  [50.9 kg (112 lb 4.8 oz)-59 kg (130 lb)] 50.9 kg (112 lb 4.8 oz) (11/16 0600)   Intake/Output: 11/15 0701 - 11/16 0700 In: 1000 [IV Piggyback:1000] Out: -  Last BM Date: 10/07/17  Physical Exam: Constitutional: No acute distress Abdomen:  Soft, nondistended, nontender to palpation.  Labs:  Recent Labs    10/07/17 2237 10/08/17 0549  WBC 8.6 9.3  HGB 15.1 13.9  HCT 44.7 40.7  PLT 261 268   Recent Labs    10/07/17 2237 10/08/17 0549  NA 139 141  K 3.5 3.4*  CL 98* 99*  CO2 26 30  GLUCOSE 170* 136*  BUN 15 13  CREATININE 0.97 0.94  CALCIUM 9.8 9.2   Recent Labs    10/08/17 0549  LABPROT 12.8  INR 0.97    Imaging: Ct Abdomen Pelvis W Contrast  Result Date: 10/08/2017 CLINICAL DATA:  51 year old male with abdominal pain. EXAM: CT ABDOMEN AND PELVIS WITH CONTRAST TECHNIQUE: Multidetector CT imaging of the abdomen and pelvis was performed using the standard protocol following bolus administration of intravenous contrast. CONTRAST:  100mL ISOVUE-300 IOPAMIDOL (ISOVUE-300) INJECTION 61% COMPARISON:  Ultrasound dated 10/08/2017 and CT dated 09/18/2015 FINDINGS: Lower chest: Small scattered pneumatoceles at the right lung base. The visualized lung bases are otherwise clear. No intra-abdominal free air or free fluid. Hepatobiliary: No focal liver abnormality is seen. No gallstones, gallbladder wall thickening, or biliary dilatation. Pancreas: Unremarkable. No pancreatic ductal dilatation or surrounding inflammatory changes. Spleen: Normal in size  without focal abnormality. Adrenals/Urinary Tract: The adrenal glands are unremarkable. The kidneys appear unremarkable as well. The urinary bladder is collapsed. Stomach/Bowel: Multiple dilated and fluid-filled loops of small bowel noted measuring up to 4.6 cm in the right lower abdomen. There is a focal area of mesenteric swelling in the right upper pelvis (series 2, image 56) with high-grade narrowing of the small bowel. This appearance is similar to the prior CT. The distal small bowel and the colon are collapsed. Vascular/Lymphatic: The abdominal aorta and IVC appear unremarkable. No portal venous gas identified. There is no adenopathy. Reproductive: The prostate and seminal vesicles are not well visualized. Other: None Musculoskeletal: He metallic density in the right iliac bone as seen on the prior CT consistent with fall left fragment. No acute osseous pathology. IMPRESSION: High-grade distal small bowel obstruction in the right lower abdomen/upper pelvis similar in appearance to the prior CT. There is focal swirling of the mesenteric at the site of obstruction. Findings may be related to underlying adhesion. Clinical correlation and surgical consult is advised. Electronically Signed   By: Elgie CollardArash  Radparvar M.D.   On: 10/08/2017 03:38   [place] Abdomen Limited Ruq  Result Date: 10/08/2017 CLINICAL DATA:  51 year old male with generalized abdominal pain and vomiting. EXAM: ULTRASOUND ABDOMEN LIMITED RIGHT UPPER QUADRANT COMPARISON:  Abdominal radiograph dated 09/19/2015 and CT dated 09/18/2015 FINDINGS: Gallbladder: No gallstones or wall thickening visualized. No sonographic Murphy sign noted by sonographer. Common bile duct: Diameter: 3 mm Liver: No focal lesion identified. Within normal limits in parenchymal echogenicity. Portal vein is  patent on color Doppler imaging with normal direction of blood flow towards the liver. Multiple dilated and fluid-filled loops of small bowel noted in the abdomen which may  be related to a small-bowel obstruction in this patient with history of bowel obstruction. Clinical correlation is recommended. CT may provide better evaluation if clinically indicated. IMPRESSION: 1. Unremarkable liver and gallbladder. 2. Dilated loops of bowel concerning for underlying small-bowel obstruction. Clinical correlation is recommended. Electronically Signed   By: Elgie CollardArash  Radparvar M.D.   On: 10/08/2017 02:26    Assessment/Plan: 51 yo male with SBO  --Currently no nausea but no flatus yet.  Continue NPO with IV fluid hydration --No NG tube at the time as patient refused.  If any nausea or vomiting however, he does understand that he would get an NG tube at that point. --awaiting return of bowel function.   Howie IllJose Luis Analys Ryden, MD [facility]
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
## 2017-10-08 NOTE — Plan of Care (Signed)
Pt still unable to have a BM or pass gas.
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
## 2017-10-08 NOTE — ED Notes (Signed)
Pt currently refusing NG tube, stating "I can't do those tubes. If you put it in, I'll just pull it back up." Agreed to speak with surgeon before revisiting placement.

## 2017-10-08 NOTE — ED Notes (Signed)
Patient transported to CT 

## 2017-10-08 NOTE — ED Notes (Signed)
Pt reports abdominal pain that started today. Pt is tender upon palpation in his upper abdomen. Denies fever or chills. Pt last BM was today but states he is not passing any gas. Pt states he drinks "two 22-oz beers after work every day." Last drink was 10/06/17.

## 2017-10-09 ENCOUNTER — Inpatient Hospital Stay: Payer: Self-pay

## 2017-10-09 LAB — BASIC METABOLIC PANEL
Anion gap: 13 (ref 5–15)
BUN: 12 mg/dL (ref 6–20)
CHLORIDE: 95 mmol/L — AB (ref 101–111)
CO2: 31 mmol/L (ref 22–32)
Calcium: 8.9 mg/dL (ref 8.9–10.3)
Creatinine, Ser: 0.9 mg/dL (ref 0.61–1.24)
GFR calc Af Amer: >60 mL/min (ref >60)
GFR calc non Af Amer: >60 mL/min (ref >60)
GLUCOSE: 171 mg/dL — AB (ref 65–99)
POTASSIUM: 2.8 mmol/L — AB (ref 3.5–5.1)
Sodium: 139 mmol/L (ref 135–145)

## 2017-10-09 LAB — HIV ANTIBODY (ROUTINE TESTING W REFLEX): HIV SCREEN 4TH GENERATION: NONREACTIVE

## 2017-10-09 MED ORDER — KCL IN DEXTROSE-NACL 40-5-0.45 MEQ/L-%-% IV SOLN
INTRAVENOUS | Status: DC
Start: 2017-10-09 — End: 2017-10-13
  Administered 2017-10-09 – 2017-10-13 (×9): via INTRAVENOUS
  Filled 2017-10-09 (×13): qty 1000

## 2017-10-09 MED ORDER — POTASSIUM CHLORIDE 10 MEQ/100ML IV SOLN
10.0000 meq | INTRAVENOUS | Status: AC
  Administered 2017-10-09 (×3): 10 meq via INTRAVENOUS
  Filled 2017-10-09 (×2): qty 100

## 2017-10-09 MED ORDER — PHENOL 1.4 % MT LIQD
1.0000 | OROMUCOSAL | Status: DC | PRN
  Filled 2017-10-09 (×2): qty 177

## 2017-10-09 MED ORDER — POTASSIUM CHLORIDE 10 MEQ/100ML IV SOLN
10.0000 meq | Freq: Once | INTRAVENOUS | Status: AC
  Administered 2017-10-09: 10 meq via INTRAVENOUS
  Filled 2017-10-09: qty 100

## 2017-10-09 MED ORDER — POTASSIUM CHLORIDE 10 MEQ/100ML IV SOLN
INTRAVENOUS | Status: AC
  Administered 2017-10-09: 10 meq
  Filled 2017-10-09: qty 100

## 2017-10-09 NOTE — Progress Notes (Signed)
Pt began vomiting in toilet at 0650. Educated pt on need for NG tube to decompress stomach. Pt agreed. NG tube placed and connected to low intermittent suction. Placement confirmed by xray. 58cm at nares.

## 2017-10-09 NOTE — Progress Notes (Signed)
Pt reported feeling nauseous and bloated. Feels like he has to have a BM but is unable to. Not passing gas. No vomiting noted. Zofran given.
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
## 2017-10-10 ENCOUNTER — Inpatient Hospital Stay: Payer: Self-pay

## 2017-10-10 DIAGNOSIS — K56609 Unspecified intestinal obstruction, unspecified as to partial versus complete obstruction: Secondary | ICD-10-CM | POA: Diagnosis not present

## 2017-10-10 LAB — BASIC METABOLIC PANEL
ANION GAP: 10 (ref 5–15)
BUN: 14 mg/dL (ref 6–20)
CO2: 31 mmol/L (ref 22–32)
Calcium: 9 mg/dL (ref 8.9–10.3)
Chloride: 96 mmol/L — ABNORMAL LOW (ref 101–111)
Creatinine, Ser: 0.97 mg/dL (ref 0.61–1.24)
Glucose, Bld: 148 mg/dL — ABNORMAL HIGH (ref 65–99)
POTASSIUM: 3.4 mmol/L — AB (ref 3.5–5.1)
SODIUM: 137 mmol/L (ref 135–145)

## 2017-10-10 LAB — MAGNESIUM: MAGNESIUM: 2.7 mg/dL — AB (ref 1.7–2.4)

## 2017-10-10 NOTE — Progress Notes (Signed)
Went into check residual from NG tube and the patient had already reconnected himself from suction. When asked how long he had been connected, he stated "for about 5 minutes." Output showed 200ml since the last marking at 0800 on the cannister. Can not determine whether the additional output is from being reconnected. Discussed with Dr. Aleen CampiPiscoya. Plan is to clamp and reconnect at 1500 to assess for residual. Discussed plan with patient and advised him not to reconnect the suction, but to allow RN to do so.
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
## 2017-10-10 NOTE — Progress Notes (Signed)
Output of 50ml was observed from NG tube at 1500. Dr. Lerry PatersonPicoya was notified. Order was placed to discontinue nasogastric tube and start clear liquid diet. Dr. Lerry PatersonPicoya advised to take liquids slow to prevent nausea and vomiting. This information was reviewed with the patient. He voiced understanding and agreement. NG tube was removed at 1510 without complications.

## 2017-10-11 ENCOUNTER — Encounter: Payer: Self-pay | Admitting: Anesthesiology

## 2017-10-11 ENCOUNTER — Inpatient Hospital Stay: Payer: Self-pay

## 2017-10-11 LAB — BASIC METABOLIC PANEL
Anion gap: 7 (ref 5–15)
BUN: 16 mg/dL (ref 6–20)
CHLORIDE: 100 mmol/L — AB (ref 101–111)
CO2: 27 mmol/L (ref 22–32)
CREATININE: 1.03 mg/dL (ref 0.61–1.24)
Calcium: 8.9 mg/dL (ref 8.9–10.3)
GFR calc Af Amer: >60 mL/min (ref >60)
GFR calc non Af Amer: >60 mL/min (ref >60)
Glucose, Bld: 145 mg/dL — ABNORMAL HIGH (ref 65–99)
Potassium: 3.6 mmol/L (ref 3.5–5.1)
SODIUM: 134 mmol/L — AB (ref 135–145)

## 2017-10-11 LAB — MAGNESIUM: Magnesium: 2.4 mg/dL (ref 1.7–2.4)

## 2017-10-11 MED ORDER — IOPAMIDOL (ISOVUE-300) INJECTION 61%
80.0000 mL | Freq: Once | INTRAVENOUS | Status: AC | PRN
  Administered 2017-10-11: 80 mL via ORAL

## 2017-10-11 MED ORDER — LACTATED RINGERS IV BOLUS (SEPSIS)
1000.0000 mL | Freq: Once | INTRAVENOUS | Status: AC
  Administered 2017-10-11: 1000 mL via INTRAVENOUS

## 2017-10-11 MED ORDER — DIATRIZOATE MEGLUMINE & SODIUM 66-10 % PO SOLN
90.0000 mL | Freq: Once | ORAL | Status: AC
  Administered 2017-10-11: 90 mL via NASOGASTRIC

## 2017-10-11 NOTE — Progress Notes (Signed)
Patient with minimal urine output during the night, bladder scan this morning shows greater than 807 ml. Attempted to call rounding surgeon at this time with no answer. Will attemot to call again. Anselm Junglingonyers,Aleece Loyd M

## 2017-10-11 NOTE — Progress Notes (Signed)
Spoke with Dr. Everlene FarrierPabon via phone about patient's low urine output. Also told Dr.Pabon about bladder scan results of greater than 807ml. New order to in and out cath patient. Anselm Junglingonyers,Meghan Tiemann M
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
## 2017-10-11 NOTE — Progress Notes (Signed)
Patient has voided minimally this shift (<20 ml).  Bladder scan reveals about 800 ml.  Spoke with Dr. Everlene FarrierPabon and received order for coude catheter.  Per Annice PihJackie, RN, no one qualified to insert on inpatient units.  Spoke with ED charge RN to request assistance.

## 2017-10-11 NOTE — Progress Notes (Signed)
Nutrition Follow Up Note   DOCUMENTATION CODES:   Non-severe (moderate) malnutrition in context of chronic illness  INTERVENTION:   Recommend monitor Mg and P as pt at refeeding risk  Recommend IV thiamine and folic acid supplementation as pt with h/o etoh abuse. Will order MVI when diet advanced.   RD will monitor for diet advancement vs the need for nutrition support   NUTRITION DIAGNOSIS:   Moderate Malnutrition related to chronic illness(etoh abuse, chronic SBOs) as evidenced by moderate muscle depletions over entire body and mild fat depletions in orbital and buccal regions, moderate fat depletions in arms and chest.  GOAL:   Patient will meet greater than or equal to 90% of their needs  - not meeting   MONITOR:   PO intake, Labs, Weight trends, I & O's, Other (Comment)(the need for nutrition support )   ASSESSMENT:   51 year old male with a recurrent small bowel obstruction.    Pt with continued SBO. Had NGT placed 11/17 r/t vomiting; pt had clamp trial and NGT removed 11/18. Clear liquids initiated 11/18; pt eating 50% of clear liquid diet. Pt noted to have BM today. No new weight since 11/16; recommend obtain new weight. Per MD note, pt to have gastrografin challenge today. Pt at moderate refeeding risk; recommend monitor P, Mg, and K for 3 days. RD will monitor for diet advancement vs the need for nutrition support.   Medications reviewed and include: lovenox, nicotine, protonix, LRS w/ 5% dextrose and KCl @125ml /hr, morphine  Labs reviewed: Na 134(L), K 3.6 wnl, Cl 100(L), Mg 2.4 wnl P 3.8 wnl 11/16 cbgs- 148, 145 x 24hrs  Diet Order:  Diet clear liquid Room service appropriate? Yes; Fluid consistency: Thin  EDUCATION NEEDS:   No education needs have been identified at this time  Skin: Reviewed RN Assessment  Last BM:  11/18  Height:   Ht Readings from Last 1 Encounters:  10/08/17 5\' 6"  (1.676 m)    Weight:   Wt Readings from Last 1 Encounters:   10/08/17 112 lb 4.8 oz (50.9 kg)    Ideal Body Weight:  64.5 kg  BMI:  Body mass index is 18.13 kg/m.  Estimated Nutritional Needs:   Kcal:  1700-2000kcal/day   Protein:  76-87g/day   Fluid:  >1.7L/day   Betsey Holidayasey Ceaser Ebeling MS, RD, LDN Pager #[phone] After Hours Pager: [phone]

## 2017-10-11 NOTE — Progress Notes (Signed)
Attempted to I&O cath patient.  Catheter inserted completely with no urine output noted.  As RN began to withdraw catheter small amount of urine noted.  Manipulated catheter at that point, applied pressure to area of bladder with no further urine noted.  I&O cath terminated at this time.  Notified MD.  Per Dr. Everlene FarrierPabon will monitor UO for the next 6-8 hours and reassess at that time.

## 2017-10-11 NOTE — Progress Notes (Signed)
Spoke with Dr. Excell Seltzerooper regarding patient's low UO since inserting coude catheter.  Orders received.  Night RN will continue to monitor.

## 2017-10-12 ENCOUNTER — Inpatient Hospital Stay: Payer: Self-pay

## 2017-10-12 LAB — CBC
HEMATOCRIT: 40 % (ref 40.0–52.0)
HEMOGLOBIN: 13.7 g/dL (ref 13.0–18.0)
MCH: 32.5 pg (ref 26.0–34.0)
MCHC: 34.3 g/dL (ref 32.0–36.0)
MCV: 94.6 fL (ref 80.0–100.0)
Platelets: 230 10*3/uL (ref 150–440)
RBC: 4.22 MIL/uL — AB (ref 4.40–5.90)
RDW: 13.9 % (ref 11.5–14.5)
WBC: 7.4 10*3/uL (ref 3.8–10.6)

## 2017-10-12 LAB — BASIC METABOLIC PANEL
ANION GAP: 9 (ref 5–15)
BUN: 17 mg/dL (ref 6–20)
CHLORIDE: 101 mmol/L (ref 101–111)
CO2: 24 mmol/L (ref 22–32)
Calcium: 8.9 mg/dL (ref 8.9–10.3)
Creatinine, Ser: 0.9 mg/dL (ref 0.61–1.24)
GFR calc Af Amer: >60 mL/min (ref >60)
GLUCOSE: 143 mg/dL — AB (ref 65–99)
POTASSIUM: 4.3 mmol/L (ref 3.5–5.1)
Sodium: 134 mmol/L — ABNORMAL LOW (ref 135–145)

## 2017-10-12 MED ORDER — DEXTROSE 5 % IV SOLN
2.0000 g | Freq: Four times a day (QID) | INTRAVENOUS | Status: DC
  Filled 2017-10-12 (×7): qty 2

## 2017-10-12 MED ORDER — SODIUM CHLORIDE 0.9 % IV BOLUS (SEPSIS)
1000.0000 mL | Freq: Once | INTRAVENOUS | Status: AC
  Administered 2017-10-12: 1000 mL via INTRAVENOUS

## 2017-10-12 MED ORDER — LACTATED RINGERS IV BOLUS (SEPSIS)
1000.0000 mL | Freq: Once | INTRAVENOUS | Status: AC
  Administered 2017-10-12: 1000 mL via INTRAVENOUS

## 2017-10-12 NOTE — Progress Notes (Signed)
2030: Foley cath flushed with 5cc NS. LR bolus given.  0000: Pt with ~ 200cc dark amber urine in foley. Repeat bladder scan shows >55736ml. Pt has no urge to void. Report given to night RN taking over care of pt.

## 2017-10-12 NOTE — Progress Notes (Signed)
Patient asked if his provider could be contacted and told that he would like to hold off on surgery because he is now using the bathroom. Notified Dr. Everlene FarrierPabon via ascom and he stated " ok, that is fine".

## 2017-10-12 NOTE — Progress Notes (Signed)
Pt w persistent distension, now tachycardic and low U/O. Pain is better, Xray w some improvement but there still persistent dilation of SB, no free air  On exam he is distended, no peritoonitis but mild diffuse tenderness  A/P Given persistent SBO, tachycardia and recurrent of his disease I do believe he is better serve with an operating. We will plan for Laparotomy today. D/W the pt on detail about the procedure, risks, benefits and possible complications ( bleeding, infection, organ injury, recurrence). He agrees

## 2017-10-12 NOTE — Progress Notes (Signed)
Notified Dr. Excell Seltzerooper about patient urine output and bladder scan; MD made writer aware that measures will be taken with other providers for POC; patient made aware;  Patient denies spasms or pain at this time. Will continue to monitor patient

## 2017-10-12 NOTE — Care Management Note (Signed)
Case Management Note  Patient Details  Name: Thomas Franklin MRN: [id number] Date of Birth: 14-May-1966  Subjective/Objective:   Admitted to [facility] with the diagnosis of small bowel obstruction. Son Carollee LeitzDequiz is in the home. [name]'s telephone number is [phone]. States he [place] in [place]. [place] at [employer] [place] year, States he is planning to re-new his insurance plan when he goes back to work, hopefully Monday.  Takes care of all basic needs himself, drives.                  Action/Plan: Laparotomy cancelled for 10/12/17. Possible discharge 10/13/17?   Expected Discharge Date:                  Expected Discharge Plan:     In-House Referral:     Discharge planning Services     Post Acute Care Choice:    Choice offered to:     DME Arranged:    DME Agency:     HH Arranged:    HH Agency:     Status of Service:     If discussed at [employer] Meetings, dates discussed:    Additional Comments:  Gwenette GreetBrenda S Devaeh Amadi, RN MSN CCM Care Management [phone] 10/12/2017, 1:58 PM

## 2017-10-12 NOTE — Anesthesia Preprocedure Evaluation (Deleted)
Anesthesia Evaluation    Airway       Dental   Pulmonary Current Smoker,          Cardiovascular     Neuro/Psych    GI/Hepatic   Endo/Other    Renal/GU      Musculoskeletal   Abdominal   Peds  Hematology   Anesthesia Other Findings   Reproductive/Obstetrics                           Anesthesia Physical Anesthesia Plan Anesthesia Quick Evaluation  

## 2017-10-12 NOTE — Progress Notes (Signed)
Revisited pt. After I left the room this am. Pt had explosive BM w flatus. Now has had 5. Currently feels much better and prefers not to have surgical intervention at this time I explained to him that given his 3rd episode of SBO he will likely require definitive intervention. He wishes to wait for now Currently not toxic or peritonitic We will order kub in am and continue to follow exams. Only do clears for now I have spent at least 35 minutes w greater 50% spent in counseling today .

## 2017-10-13 ENCOUNTER — Inpatient Hospital Stay: Payer: Self-pay

## 2017-10-13 ENCOUNTER — Encounter
Admission: EM | Disposition: A | Discharge status: Home / Self Care | Payer: Self-pay | Source: Home / Self Care | Attending: General Surgery

## 2017-10-13 LAB — CBC
HCT: 35.9 % — ABNORMAL LOW (ref 40.0–52.0)
Hemoglobin: 12.1 g/dL — ABNORMAL LOW (ref 13.0–18.0)
MCH: 32.4 pg (ref 26.0–34.0)
MCHC: 33.7 g/dL (ref 32.0–36.0)
MCV: 96.1 fL (ref 80.0–100.0)
PLATELETS: 210 10*3/uL (ref 150–440)
RBC: 3.73 MIL/uL — AB (ref 4.40–5.90)
RDW: 13.6 % (ref 11.5–14.5)
WBC: 6.1 10*3/uL (ref 3.8–10.6)

## 2017-10-13 LAB — BASIC METABOLIC PANEL
Anion gap: 5 (ref 5–15)
BUN: 6 mg/dL (ref 6–20)
CHLORIDE: 110 mmol/L (ref 101–111)
CO2: 18 mmol/L — AB (ref 22–32)
Calcium: 7.7 mg/dL — ABNORMAL LOW (ref 8.9–10.3)
Creatinine, Ser: 0.75 mg/dL (ref 0.61–1.24)
GFR calc Af Amer: >60 mL/min (ref >60)
GLUCOSE: 106 mg/dL — AB (ref 65–99)
POTASSIUM: 4 mmol/L (ref 3.5–5.1)
Sodium: 133 mmol/L — ABNORMAL LOW (ref 135–145)

## 2017-10-13 SURGERY — LAPAROTOMY, EXPLORATORY
Anesthesia: Choice

## 2017-10-13 SURGICAL SUPPLY — 44 items
APPLIER CLIP 11 MED OPEN (CLIP) ×3
APPLIER CLIP 13 LRG OPEN (CLIP) ×3
BLADE CLIPPER SURG (BLADE) ×3 IMPLANT
BLADE SURG 15 STRL LF DISP TIS (BLADE) ×1 IMPLANT
BLADE SURG 15 STRL SS (BLADE) ×2
CANISTER SUCT 3000ML PPV (MISCELLANEOUS) ×3 IMPLANT
CHLORAPREP W/TINT 26ML (MISCELLANEOUS) ×3 IMPLANT
CLIP APPLIE 11 MED OPEN (CLIP) ×1 IMPLANT
CLIP APPLIE 13 LRG OPEN (CLIP) ×1 IMPLANT
DRAPE LAPAROTOMY 100X77 ABD (DRAPES) ×3 IMPLANT
DRAPE TABLE BACK 80X90 (DRAPES) ×3 IMPLANT
DRSG TEGADERM 2-3/8X2-3/4 SM (GAUZE/BANDAGES/DRESSINGS) ×6 IMPLANT
DRSG TELFA 3X8 NADH (GAUZE/BANDAGES/DRESSINGS) ×3 IMPLANT
ELECT BLADE 6.5 EXT (BLADE) ×3 IMPLANT
ELECT REM PT RETURN 9FT ADLT (ELECTROSURGICAL) ×3
ELECTRODE REM PT RTRN 9FT ADLT (ELECTROSURGICAL) ×1 IMPLANT
GAUZE SPONGE 4X4 12PLY STRL (GAUZE/BANDAGES/DRESSINGS) ×6 IMPLANT
GLOVE BIO SURGEON STRL SZ7 (GLOVE) ×3 IMPLANT
GOWN STRL REUS W/ TWL LRG LVL3 (GOWN DISPOSABLE) ×2 IMPLANT
GOWN STRL REUS W/TWL LRG LVL3 (GOWN DISPOSABLE) ×4
HANDLE SUCTION POOLE (INSTRUMENTS) ×1 IMPLANT
HANDLE YANKAUER SUCT BULB TIP (MISCELLANEOUS) ×3 IMPLANT
LIGASURE IMPACT 36 18CM CVD LR (INSTRUMENTS) IMPLANT
NEEDLE HYPO 22GX1.5 SAFETY (NEEDLE) ×6 IMPLANT
NEEDLE HYPO 25X1 1.5 SAFETY (NEEDLE) ×3 IMPLANT
PACK BASIN MAJOR ARMC (MISCELLANEOUS) ×3 IMPLANT
RELOAD PROXIMATE 75MM BLUE (ENDOMECHANICALS) IMPLANT
SPONGE LAP 18X18 5 PK (GAUZE/BANDAGES/DRESSINGS) ×3 IMPLANT
SPONGE LAP 18X36 2PK (MISCELLANEOUS) IMPLANT
STAPLER PROXIMATE 75MM BLUE (STAPLE) IMPLANT
STAPLER SKIN PROX 35W (STAPLE) ×3 IMPLANT
SUCTION POOLE HANDLE (INSTRUMENTS) ×3
SUT PDS AB 1 TP1 96 (SUTURE) ×6 IMPLANT
SUT SILK 2 0 (SUTURE) ×2
SUT SILK 2 0 SH CR/8 (SUTURE) ×3 IMPLANT
SUT SILK 2 0SH CR/8 30 (SUTURE) ×3 IMPLANT
SUT SILK 2-0 18XBRD TIE 12 (SUTURE) ×1 IMPLANT
SUT VIC AB 0 CT1 36 (SUTURE) ×6 IMPLANT
SUT VIC AB 2-0 SH 27 (SUTURE) ×4
SUT VIC AB 2-0 SH 27XBRD (SUTURE) ×2 IMPLANT
SYR 30ML LL (SYRINGE) ×6 IMPLANT
SYR 3ML LL SCALE MARK (SYRINGE) ×3 IMPLANT
TAPE MICROFOAM 4IN (TAPE) ×3 IMPLANT
TRAY FOLEY W/METER SILVER 16FR (SET/KITS/TRAYS/PACK) ×3 IMPLANT

## 2017-10-13 NOTE — Progress Notes (Signed)
Patient provided discharge teaching and verbalized understanding. Patient had eaten and tolerated well prior to discharge and also voided post urinary catheter removal. Patient is being transported by a family member home.

## 2017-10-13 NOTE — Discharge Summary (Signed)
Patient ID: Thomas Franklin MRN: [id number] DOB/AGE: 03-05-1966 51 y.o.  Admit date: 10/08/2017 Discharge date: 10/13/2017   Discharge Diagnoses:  Active Problems:   SBO (small bowel obstruction) [facility](HCC)    Hospital Course: 51 year old male with a previous history of a laparotomy as a child presented with nausea vomiting consistent with small bowel obstruction and confirmed by CT scan. Patient was admitted with NG tube decompression and he continued to slowly improve. I am after a couple days had performed a Gastrografin challenge with no definitive evidence of contrast reaching the colon but the patient clinically improved having multiple bowel movements and flatus. More importantly patient was tolerating a clear liquid diet. I discussed with the patient in detail about my thought process given that this is the third episode of small bowel obstruction I strongly suggested to go ahead and perform a laparotomy to assess the degree of the partial obstruction. This is likely the right related to adhesions from his previous operation and given the recurrence I do think that the best and more prudent thing to do was to do a laparotomy. The patient adamantly refused that and wanted to go home and he stated that he wants to spend the holidays at home and they want any surgical intervention. For that reason I decided to let him will home but he understands that things can turn around for the worse. And the latest KUB show evidence of improvement in his gas pattern but there still some mildly dilated loops of small bowel. Again I reiterated my concerns for the bowel obstruction but he was adamant about going home. I explained to him the risks of going home and he understands this fully. His condition at time of discharge was stable    Disposition: 01-Home or Self Care  Discharge Instructions    Call MD for:  difficulty breathing, headache or visual disturbances   Complete by:  As directed    Call MD for:   extreme fatigue   Complete by:  As directed    Call MD for:  hives   Complete by:  As directed    Call MD for:  persistant dizziness or light-headedness   Complete by:  As directed    Call MD for:  persistant nausea and vomiting   Complete by:  As directed    Call MD for:  redness, tenderness, or signs of infection (pain, swelling, redness, odor or green/yellow discharge around incision site)   Complete by:  As directed    Call MD for:  severe uncontrolled pain   Complete by:  As directed    Call MD for:  temperature >100.4   Complete by:  As directed    Diet - low sodium heart healthy   Complete by:  As directed    Discharge instructions   Complete by:  As directed    DC home once tolerating regular diet   Increase activity slowly   Complete by:  As directed      Allergies as of 10/13/2017   No Known Allergies     Medication List    TAKE these medications   ondansetron 4 MG disintegrating tablet Commonly known as:  ZOFRAN ODT Take 1 tablet (4 mg total) by mouth every 8 (eight) hours as needed for nausea or vomiting.   oxyCODONE-acetaminophen 5-325 MG tablet Commonly known as:  PERCOCET/ROXICET Take 1-2 tablets by mouth every 4 (four) hours as needed for moderate pain.      Follow-up Information  Leafy RoPabon, Doyce Saling F, MD. Nyra CapesGo on 11/03/2017.   Specialty:  General Surgery Why:  Dr. Everlene FarrierPabon, Wednesday, 12/12 at 1:30 p.m. (arrive by 1:15 p.m.), [phone] Contact information: [address] [place] [address] [phone]            Thomas Franklin Thomas Lamora, MD FACS

## 2017-10-13 NOTE — Care Management (Signed)
Patient to discharge home today.  Patient is a self pay, however he states that he will be re enrolling in his insurance on Monday.  In the interim patient provided with application to [facility], Medication Management, and "The Network:  Your Guide to [employer] [facility] [employer] [place]"  Booklet.  RNCM signing off.

## 2017-11-03 ENCOUNTER — Inpatient Hospital Stay: Payer: Self-pay | Admitting: Surgery

## 2017-11-11 ENCOUNTER — Encounter: Payer: Self-pay | Admitting: General Practice

## 2017-11-11 ENCOUNTER — Inpatient Hospital Stay: Payer: Self-pay | Admitting: Surgery

## 2017-11-11 ENCOUNTER — Telehealth: Payer: Self-pay | Admitting: General Practice

## 2017-11-11 NOTE — Telephone Encounter (Signed)
Unable to leave message for the patient to r/s his no showed appointment with Dr. Excell Seltzerooper for a Follow up: Small Bowel Obstruction 10/13/2017, I have also mailed a letter to the patient to call the office to r/s.

## 2018-10-06 ENCOUNTER — Inpatient Hospital Stay: Payer: 59

## 2018-10-06 ENCOUNTER — Other Ambulatory Visit: Payer: Self-pay

## 2018-10-06 ENCOUNTER — Inpatient Hospital Stay
Admission: EM | Admit: 2018-10-06 | Discharge: 2018-10-10 | DRG: 389 | Discharge status: Against Medical Advice | Payer: 59 | Attending: Surgery | Admitting: Surgery

## 2018-10-06 ENCOUNTER — Encounter: Payer: Self-pay | Admitting: Emergency Medicine

## 2018-10-06 ENCOUNTER — Emergency Department: Payer: 59

## 2018-10-06 DIAGNOSIS — F17210 Nicotine dependence, cigarettes, uncomplicated: Secondary | ICD-10-CM | POA: Diagnosis present

## 2018-10-06 DIAGNOSIS — K56609 Unspecified intestinal obstruction, unspecified as to partial versus complete obstruction: Secondary | ICD-10-CM | POA: Diagnosis not present

## 2018-10-06 DIAGNOSIS — Z681 Body mass index (BMI) 19 or less, adult: Secondary | ICD-10-CM | POA: Diagnosis not present

## 2018-10-06 DIAGNOSIS — Z9119 Patient's noncompliance with other medical treatment and regimen: Secondary | ICD-10-CM

## 2018-10-06 DIAGNOSIS — Z801 Family history of malignant neoplasm of trachea, bronchus and lung: Secondary | ICD-10-CM

## 2018-10-06 DIAGNOSIS — E440 Moderate protein-calorie malnutrition: Secondary | ICD-10-CM

## 2018-10-06 DIAGNOSIS — K5652 Intestinal adhesions [bands] with complete obstruction: Secondary | ICD-10-CM | POA: Diagnosis present

## 2018-10-06 DIAGNOSIS — Z978 Presence of other specified devices: Secondary | ICD-10-CM

## 2018-10-06 DIAGNOSIS — R109 Unspecified abdominal pain: Secondary | ICD-10-CM | POA: Diagnosis present

## 2018-10-06 LAB — COMPREHENSIVE METABOLIC PANEL
ALBUMIN: 4.6 g/dL (ref 3.5–5.0)
ALT: 20 U/L (ref 0–44)
ANION GAP: 10 (ref 5–15)
AST: 36 U/L (ref 15–41)
Alkaline Phosphatase: 80 U/L (ref 38–126)
BILIRUBIN TOTAL: 0.9 mg/dL (ref 0.3–1.2)
BUN: 13 mg/dL (ref 6–20)
CHLORIDE: 101 mmol/L (ref 98–111)
CO2: 25 mmol/L (ref 22–32)
Calcium: 9.5 mg/dL (ref 8.9–10.3)
Creatinine, Ser: 0.94 mg/dL (ref 0.61–1.24)
GFR calc Af Amer: >60 mL/min (ref >60)
GFR calc non Af Amer: >60 mL/min (ref >60)
GLUCOSE: 146 mg/dL — AB (ref 70–99)
Potassium: 3.7 mmol/L (ref 3.5–5.1)
SODIUM: 136 mmol/L (ref 135–145)
Total Protein: 8.8 g/dL — ABNORMAL HIGH (ref 6.5–8.1)

## 2018-10-06 LAB — CBC WITH DIFFERENTIAL/PLATELET
Abs Immature Granulocytes: 0.02 10*3/uL (ref 0.00–0.07)
BASOS ABS: 0.1 10*3/uL (ref 0.0–0.1)
Basophils Relative: 1 %
EOS ABS: 0.1 10*3/uL (ref 0.0–0.5)
Eosinophils Relative: 1 %
HEMATOCRIT: 44.4 % (ref 39.0–52.0)
Hemoglobin: 15.3 g/dL (ref 13.0–17.0)
IMMATURE GRANULOCYTES: 0 %
LYMPHS ABS: 1.3 10*3/uL (ref 0.7–4.0)
Lymphocytes Relative: 17 %
MCH: 32.2 pg (ref 26.0–34.0)
MCHC: 34.5 g/dL (ref 30.0–36.0)
MCV: 93.5 fL (ref 80.0–100.0)
Monocytes Absolute: 0.4 10*3/uL (ref 0.1–1.0)
Monocytes Relative: 6 %
NEUTROS PCT: 75 %
NRBC: 0 % (ref 0.0–0.2)
Neutro Abs: 5.5 10*3/uL (ref 1.7–7.7)
PLATELETS: 240 10*3/uL (ref 150–400)
RBC: 4.75 MIL/uL (ref 4.22–5.81)
RDW: 13.6 % (ref 11.5–15.5)
WBC: 7.2 10*3/uL (ref 4.0–10.5)

## 2018-10-06 LAB — LIPASE, BLOOD: Lipase: 24 U/L (ref 11–51)

## 2018-10-06 MED ORDER — ONDANSETRON HCL 4 MG/2ML IJ SOLN
4.0000 mg | Freq: Once | INTRAMUSCULAR | Status: AC
  Administered 2018-10-06: 4 mg via INTRAVENOUS
  Filled 2018-10-06: qty 2

## 2018-10-06 MED ORDER — MORPHINE SULFATE (PF) 2 MG/ML IV SOLN
2.0000 mg | Freq: Once | INTRAVENOUS | Status: AC
  Administered 2018-10-06: 2 mg via INTRAVENOUS
  Filled 2018-10-06: qty 1

## 2018-10-06 MED ORDER — POTASSIUM CHLORIDE 2 MEQ/ML IV SOLN
INTRAVENOUS | Status: DC
  Administered 2018-10-06 – 2018-10-07 (×5): via INTRAVENOUS
  Filled 2018-10-06 (×5): qty 1000

## 2018-10-06 MED ORDER — MENTHOL 3 MG MT LOZG
1.0000 | LOZENGE | OROMUCOSAL | Status: DC | PRN
  Administered 2018-10-07: 3 mg via ORAL
  Filled 2018-10-06: qty 9

## 2018-10-06 MED ORDER — MORPHINE SULFATE (PF) 2 MG/ML IV SOLN
INTRAVENOUS | Status: AC
  Filled 2018-10-06: qty 1

## 2018-10-06 MED ORDER — MORPHINE SULFATE (PF) 2 MG/ML IV SOLN
2.0000 mg | INTRAVENOUS | Status: DC | PRN
  Administered 2018-10-07: 2 mg via INTRAVENOUS
  Filled 2018-10-06: qty 1

## 2018-10-06 MED ORDER — KETOROLAC TROMETHAMINE 30 MG/ML IJ SOLN
30.0000 mg | Freq: Four times a day (QID) | INTRAMUSCULAR | Status: DC | PRN
  Administered 2018-10-06 – 2018-10-07 (×2): 30 mg via INTRAVENOUS
  Filled 2018-10-06 (×2): qty 1

## 2018-10-06 MED ORDER — IOPAMIDOL (ISOVUE-300) INJECTION 61%
100.0000 mL | Freq: Once | INTRAVENOUS | Status: AC | PRN
  Administered 2018-10-06: 100 mL via INTRAVENOUS

## 2018-10-06 MED ORDER — HEPARIN SODIUM (PORCINE) 5000 UNIT/ML IJ SOLN
5000.0000 [IU] | Freq: Three times a day (TID) | INTRAMUSCULAR | Status: DC
  Administered 2018-10-06 – 2018-10-07 (×5): 5000 [IU] via SUBCUTANEOUS
  Filled 2018-10-06 (×5): qty 1
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
## 2018-10-06 NOTE — Progress Notes (Signed)
When I took the toradol to the patient he asked if the medicine was going to help him sleep.  I informed him that it would not but that I could call for an order for sleep med.  Zach PA notified and will talk to Dr Earlene Plateravis

## 2018-10-06 NOTE — Progress Notes (Signed)
Patient discussed with Dr. Manson PasseyBrown, chart and CT reviewed. Will admit to surgical service with NG decompression requested and strongly encouraged (patient had refused, advised to inform patient regarding risks of non-decompression).   Full H&P to follow.  -- Scherrie GerlachJason E. Earlene Plateravis, MD, RPVI [place]: [facility] General Surgery - Partnering for exceptional care. Office: [phone]
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
## 2018-10-06 NOTE — ED Triage Notes (Signed)
Patient ambulatory to triage with steady gait, without difficulty or distress noted; pt reports having upper abd pain accomp by N/V; st hx of same with obstruction
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
## 2018-10-06 NOTE — Progress Notes (Signed)
Dr. Rosana Hoes called to verify that he wanted second Met B drawn, earlier labs WNLs; ".. Wait until tomorrow..."; acknowledged; Met B order modified. Barbaraann Faster, RN 5:04 AM 10/06/2018

## 2018-10-06 NOTE — Progress Notes (Signed)
Patient requesting something for pain.  Zach PA notified and ordered toradol

## 2018-10-06 NOTE — ED Notes (Signed)
Patient transported to CT at this time. 

## 2018-10-06 NOTE — ED Notes (Signed)
Pt refuses to allow this RN to place an NG tube. Pt has expressed multiple times to both Dr Donnal MoatBown and myself that he did not want an NG tube placed, even though the benefits and risks were explained to him.

## 2018-10-07 LAB — CBC
HCT: 43.4 % (ref 39.0–52.0)
Hemoglobin: 14 g/dL (ref 13.0–17.0)
MCH: 31.7 pg (ref 26.0–34.0)
MCHC: 32.3 g/dL (ref 30.0–36.0)
MCV: 98.2 fL (ref 80.0–100.0)
Platelets: 245 10*3/uL (ref 150–400)
RBC: 4.42 MIL/uL (ref 4.22–5.81)
RDW: 13.8 % (ref 11.5–15.5)
WBC: 5.8 10*3/uL (ref 4.0–10.5)
nRBC: 0 % (ref 0.0–0.2)

## 2018-10-07 LAB — BASIC METABOLIC PANEL
Anion gap: 11 (ref 5–15)
BUN: 13 mg/dL (ref 6–20)
CHLORIDE: 101 mmol/L (ref 98–111)
CO2: 28 mmol/L (ref 22–32)
Calcium: 8.9 mg/dL (ref 8.9–10.3)
Creatinine, Ser: 0.86 mg/dL (ref 0.61–1.24)
GFR calc non Af Amer: >60 mL/min (ref >60)
Glucose, Bld: 69 mg/dL — ABNORMAL LOW (ref 70–99)
POTASSIUM: 3.8 mmol/L (ref 3.5–5.1)
SODIUM: 140 mmol/L (ref 135–145)

## 2018-10-07 MED ORDER — KCL IN DEXTROSE-NACL 20-5-0.45 MEQ/L-%-% IV SOLN
INTRAVENOUS | Status: DC
  Administered 2018-10-07 – 2018-10-10 (×9): via INTRAVENOUS
  Filled 2018-10-07 (×10): qty 1000

## 2018-10-07 MED ORDER — ENOXAPARIN SODIUM 40 MG/0.4ML ~~LOC~~ SOLN
40.0000 mg | SUBCUTANEOUS | Status: DC
  Administered 2018-10-08 – 2018-10-10 (×3): 40 mg via SUBCUTANEOUS
  Filled 2018-10-07 (×3): qty 0.4

## 2018-10-07 MED ORDER — DIPHENHYDRAMINE HCL 50 MG/ML IJ SOLN
50.0000 mg | Freq: Every evening | INTRAMUSCULAR | Status: DC | PRN
  Administered 2018-10-07: 50 mg via INTRAVENOUS
  Filled 2018-10-07: qty 1
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
## 2018-10-07 NOTE — Plan of Care (Signed)
NG tube output being monitored. PRN pain medication provided. Encouraged to ambulate. The patient ambulates in the hallway.  Problem: Education: Goal: Knowledge of General Education information will improve Description Including pain rating scale, medication(s)/side effects and non-pharmacologic comfort measures Outcome: Progressing   Problem: Health Behavior/Discharge Planning: Goal: Ability to manage health-related needs will improve Outcome: Progressing   Problem: Clinical Measurements: Goal: Ability to maintain clinical measurements within normal limits will improve Outcome: Progressing Goal: Will remain free from infection Outcome: Progressing Goal: Diagnostic test results will improve Outcome: Progressing Goal: Respiratory complications will improve Outcome: Progressing Goal: Cardiovascular complication will be avoided Outcome: Progressing   Problem: Activity: Goal: Risk for activity intolerance will decrease Outcome: Progressing   Problem: Nutrition: Goal: Adequate nutrition will be maintained Outcome: Progressing   Problem: Coping: Goal: Level of anxiety will decrease Outcome: Progressing   Problem: Elimination: Goal: Will not experience complications related to bowel motility Outcome: Progressing Goal: Will not experience complications related to urinary retention Outcome: Progressing   Problem: Pain Managment: Goal: General experience of comfort will improve Outcome: Progressing   Problem: Safety: Goal: Ability to remain free from injury will improve Outcome: Progressing   Problem: Skin Integrity: Goal: Risk for impaired skin integrity will decrease Outcome: Progressing
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
## 2018-10-08 ENCOUNTER — Inpatient Hospital Stay: Payer: 59

## 2018-10-08 LAB — CBC
HCT: 40 % (ref 39.0–52.0)
Hemoglobin: 13 g/dL (ref 13.0–17.0)
MCH: 31.4 pg (ref 26.0–34.0)
MCHC: 32.5 g/dL (ref 30.0–36.0)
MCV: 96.6 fL (ref 80.0–100.0)
Platelets: 254 10*3/uL (ref 150–400)
RBC: 4.14 MIL/uL — ABNORMAL LOW (ref 4.22–5.81)
RDW: 13.7 % (ref 11.5–15.5)
WBC: 3.3 10*3/uL — ABNORMAL LOW (ref 4.0–10.5)
nRBC: 0 % (ref 0.0–0.2)

## 2018-10-08 LAB — BASIC METABOLIC PANEL
Anion gap: 15 (ref 5–15)
BUN: 16 mg/dL (ref 6–20)
CALCIUM: 8.8 mg/dL — AB (ref 8.9–10.3)
CO2: 26 mmol/L (ref 22–32)
CREATININE: 0.86 mg/dL (ref 0.61–1.24)
Chloride: 100 mmol/L (ref 98–111)
GFR calc Af Amer: >60 mL/min (ref >60)
GLUCOSE: 142 mg/dL — AB (ref 70–99)
Potassium: 3.8 mmol/L (ref 3.5–5.1)
Sodium: 141 mmol/L (ref 135–145)

## 2018-10-08 MED ORDER — KETOROLAC TROMETHAMINE 30 MG/ML IJ SOLN
30.0000 mg | Freq: Once | INTRAMUSCULAR | Status: AC
  Administered 2018-10-08: 30 mg via INTRAVENOUS
  Filled 2018-10-08: qty 1

## 2018-10-08 NOTE — Plan of Care (Signed)
The patient has been stable yet wanted to know when NG tube would come out. Dr. Earlene Plateravis has seen the patient and indicates there is still high output from the NG tube. The MD has mentioned to the patient of keeping the NG tube over the night and perform a x-ray of the abdomen again in the morning and if things do not improve surgery will be the next option. NG tube output is currently being monitored. Had a small bowel movement around the size of  a quarter today. Per patient he is passing a small amount of gas. The patient will continue to be monitored and have the x-ray of the abdomen in the morning. Pain has been stable at this time. The patient is NPO. NG tube to LIWS.  Problem: Education: Goal: Knowledge of General Education information will improve Description Including pain rating scale, medication(s)/side effects and non-pharmacologic comfort measures Outcome: Progressing   Problem: Health Behavior/Discharge Planning: Goal: Ability to manage health-related needs will improve Outcome: Progressing   Problem: Clinical Measurements: Goal: Ability to maintain clinical measurements within normal limits will improve Outcome: Progressing Goal: Will remain free from infection Outcome: Progressing Goal: Diagnostic test results will improve Outcome: Progressing Goal: Respiratory complications will improve Outcome: Progressing Goal: Cardiovascular complication will be avoided Outcome: Progressing   Problem: Activity: Goal: Risk for activity intolerance will decrease Outcome: Progressing   Problem: Nutrition: Goal: Adequate nutrition will be maintained Outcome: Progressing   Problem: Coping: Goal: Level of anxiety will decrease Outcome: Progressing   Problem: Elimination: Goal: Will not experience complications related to bowel motility Outcome: Progressing Goal: Will not experience complications related to urinary retention Outcome: Progressing   Problem: Pain Managment: Goal:  General experience of comfort will improve Outcome: Progressing   Problem: Safety: Goal: Ability to remain free from injury will improve Outcome: Progressing   Problem: Skin Integrity: Goal: Risk for impaired skin integrity will decrease Outcome: Progressing

## 2018-10-08 NOTE — Progress Notes (Addendum)
[address]- One time dose of Toradol approved for pain.
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
## 2018-10-09 ENCOUNTER — Inpatient Hospital Stay: Payer: 59

## 2018-10-09 LAB — BASIC METABOLIC PANEL
Anion gap: 7 (ref 5–15)
BUN: 11 mg/dL (ref 6–20)
CO2: 30 mmol/L (ref 22–32)
Calcium: 8.8 mg/dL — ABNORMAL LOW (ref 8.9–10.3)
Chloride: 103 mmol/L (ref 98–111)
Creatinine, Ser: 0.76 mg/dL (ref 0.61–1.24)
GFR calc Af Amer: >60 mL/min (ref >60)
GFR calc non Af Amer: >60 mL/min (ref >60)
Glucose, Bld: 154 mg/dL — ABNORMAL HIGH (ref 70–99)
Potassium: 3.5 mmol/L (ref 3.5–5.1)
Sodium: 140 mmol/L (ref 135–145)

## 2018-10-09 MED ORDER — KETOROLAC TROMETHAMINE 30 MG/ML IJ SOLN: 30.0000 mg | Freq: Once | INTRAMUSCULAR | Status: AC | PRN

## 2018-10-09 NOTE — Progress Notes (Signed)
[address]- FYI, requesting to see you around 1200 or 1pm and still keeps repeating he is going home.

## 2018-10-09 NOTE — Plan of Care (Signed)
The patient will keep the NG tube one more day after talking with Dr. Earlene Plateravis. Possible surgery or NG vs NG tube removal tomorrow. Having loose stools  and the patient reported passing small amounts of gas.  Problem: Education: Goal: Knowledge of General Education information will improve Description Including pain rating scale, medication(s)/side effects and non-pharmacologic comfort measures Outcome: Progressing   Problem: Health Behavior/Discharge Planning: Goal: Ability to manage health-related needs will improve Outcome: Progressing   Problem: Clinical Measurements: Goal: Ability to maintain clinical measurements within normal limits will improve Outcome: Progressing Goal: Will remain free from infection Outcome: Progressing Goal: Diagnostic test results will improve Outcome: Progressing Goal: Respiratory complications will improve Outcome: Progressing Goal: Cardiovascular complication will be avoided Outcome: Progressing   Problem: Activity: Goal: Risk for activity intolerance will decrease Outcome: Progressing   Problem: Nutrition: Goal: Adequate nutrition will be maintained Outcome: Progressing   Problem: Coping: Goal: Level of anxiety will decrease Outcome: Progressing   Problem: Elimination: Goal: Will not experience complications related to bowel motility Outcome: Progressing Goal: Will not experience complications related to urinary retention Outcome: Progressing   Problem: Pain Managment: Goal: General experience of comfort will improve Outcome: Progressing   Problem: Safety: Goal: Ability to remain free from injury will improve Outcome: Progressing   Problem: Skin Integrity: Goal: Risk for impaired skin integrity will decrease Outcome: Progressing
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
## 2018-10-10 ENCOUNTER — Inpatient Hospital Stay: Payer: 59

## 2018-10-10 LAB — BASIC METABOLIC PANEL
Anion gap: 5 (ref 5–15)
BUN: 6 mg/dL (ref 6–20)
CO2: 26 mmol/L (ref 22–32)
Calcium: 8.4 mg/dL — ABNORMAL LOW (ref 8.9–10.3)
Chloride: 107 mmol/L (ref 98–111)
Creatinine, Ser: 0.76 mg/dL (ref 0.61–1.24)
GFR calc Af Amer: >60 mL/min (ref >60)
GLUCOSE: 116 mg/dL — AB (ref 70–99)
POTASSIUM: 3.6 mmol/L (ref 3.5–5.1)
Sodium: 138 mmol/L (ref 135–145)

## 2018-10-10 MED ORDER — BOOST / RESOURCE BREEZE PO LIQD CUSTOM
1.0000 | Freq: Three times a day (TID) | ORAL | Status: DC
  Administered 2018-10-10: 1 via ORAL

## 2018-10-10 NOTE — Plan of Care (Signed)
10/10/2018 12:47 PM  Patient is addiment in leaving against medical advice. Patient is responding to intervention, but still requires continued monitoring to ensure proper GI function had returned.   Madie RenoMisty D Sophie Tamez, RN
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
## 2018-10-10 NOTE — Progress Notes (Addendum)
Education information given to the patient on small bowel obstruction.  I thoroughly explained to him what to look for and to return to the ER if he has any complications.  Patient is going to drive himself home.  Volunteers are transporting the patient to his car via wheelchair

## 2018-10-19 ENCOUNTER — Other Ambulatory Visit: Payer: Self-pay | Admitting: Surgery

## 2018-10-24 NOTE — Discharge Summary (Signed)
Physician Discharge Summary  Patient ID: Latanya MaudlinMichael Sunga MRN: [id number] DOB/AGE: Dec 27, 1965 52 y.o.  Admit date: 10/06/2018 Discharge date: 10/10/2018  Admission Diagnoses:  Discharge Diagnoses:  Active Problems:   Intestinal adhesions with complete obstruction (HCC)   SBO (small bowel obstruction) (HCC)   Malnutrition of moderate degree  Discharged Condition: fair  Hospital Course: 52 y.o. male [facility] for abdominal pain with nausea and non-bloody emesis. Workup was found to be significant for CT imaging demonstrating high-grade SBO attributed to post-surgical adhesions. One year ago, patient had SBO managed non-operatively. Nasogastric decompression was advised with possibility of surgery if worsens/does not improve, but patient refused NG tube. Abdominal x-ray was obtained and reviewed with patient, demonstrating high-grade SBO. Patient agreed with NG tube. Each day of patient's admission, he insisted upon removal of NG tube and to leave hospital until each day the risks of doing so were reiterated, and patient each day agreed to continue treatment x 1 more day. Though somewhat improved abdominal x-ray, patient continued to have high output from NG tube with only limited radiographic improvement and persistent SBO. Surgery was advised, but patient refused, though he agreed to nasogastric decompression x 1 more day. The following day, after a few episodes of flatus without much radiographic improvement and only slightly decreased NG tube drainage, surgery was once more discussed vs further nasogastric decompression, but patient insisted upon removal of NG tube and advancement of his diet with intent to leave hospital against medical advice. Appropriate instructions and indications for return to hospital were provided, and patient left hospital against medical advice after all of his questions were answered to his expressed satisfaction.  Consults: None  Significant Diagnostic  Studies: radiology: KUB: incompletely improved SBO and CT scan: SBO  Treatments: IV hydration and nasogastric decompression  Discharge Exam: Blood pressure 94/64, pulse 69, temperature 98.1 F (36.7 C), temperature source Oral, resp. rate 16, height 5\' 6"  (1.676 m), weight 50.9 kg, SpO2 99 %. General appearance: alert, cooperative, appears stated age and no distress GI: soft, non-tender; bowel sounds normal; no masses,  no organomegaly  Disposition:   Allergies as of 10/10/2018   No Known Allergies     Medication List    ASK your doctor about these medications   ondansetron 4 MG disintegrating tablet Commonly known as:  ZOFRAN-ODT Take 1 tablet (4 mg total) by mouth every 8 (eight) hours as needed for nausea or vomiting.   oxyCODONE-acetaminophen 5-325 MG tablet Commonly known as:  PERCOCET/ROXICET Take 1-2 tablets by mouth every 4 (four) hours as needed for moderate pain.       Signed: Ancil LinseyJason Evan  10/24/2018, 8:53 AM

## 2021-06-17 ENCOUNTER — Emergency Department: Payer: BC Managed Care – PPO

## 2021-06-17 ENCOUNTER — Inpatient Hospital Stay
Admission: EM | Admit: 2021-06-17 | Discharge: 2021-06-19 | DRG: 388 | Disposition: A | Discharge status: Home / Self Care | Payer: BC Managed Care – PPO | Attending: Surgery | Admitting: Surgery

## 2021-06-17 ENCOUNTER — Other Ambulatory Visit: Payer: Self-pay

## 2021-06-17 ENCOUNTER — Encounter: Payer: Self-pay | Admitting: *Deleted

## 2021-06-17 DIAGNOSIS — U071 COVID-19: Secondary | ICD-10-CM | POA: Diagnosis present

## 2021-06-17 DIAGNOSIS — K56601 Complete intestinal obstruction, unspecified as to cause: Secondary | ICD-10-CM

## 2021-06-17 DIAGNOSIS — K56609 Unspecified intestinal obstruction, unspecified as to partial versus complete obstruction: Secondary | ICD-10-CM | POA: Diagnosis present

## 2021-06-17 DIAGNOSIS — Z5320 Procedure and treatment not carried out because of patient's decision for unspecified reasons: Secondary | ICD-10-CM | POA: Diagnosis present

## 2021-06-17 DIAGNOSIS — F17210 Nicotine dependence, cigarettes, uncomplicated: Secondary | ICD-10-CM | POA: Diagnosis present

## 2021-06-17 LAB — COMPREHENSIVE METABOLIC PANEL
ALT: 31 U/L (ref 0–44)
AST: 64 U/L — ABNORMAL HIGH (ref 15–41)
Albumin: 3.7 g/dL (ref 3.5–5.0)
Alkaline Phosphatase: 203 U/L — ABNORMAL HIGH (ref 38–126)
Anion gap: 15 (ref 5–15)
BUN: 6 mg/dL (ref 6–20)
CO2: 24 mmol/L (ref 22–32)
Calcium: 9.8 mg/dL (ref 8.9–10.3)
Chloride: 91 mmol/L — ABNORMAL LOW (ref 98–111)
Creatinine, Ser: 0.86 mg/dL (ref 0.61–1.24)
GFR, Estimated: >60 mL/min (ref >60)
Glucose, Bld: 111 mg/dL — ABNORMAL HIGH (ref 70–99)
Potassium: 4 mmol/L (ref 3.5–5.1)
Sodium: 130 mmol/L — ABNORMAL LOW (ref 135–145)
Total Bilirubin: 1 mg/dL (ref 0.3–1.2)
Total Protein: 8.8 g/dL — ABNORMAL HIGH (ref 6.5–8.1)

## 2021-06-17 LAB — CBC
HCT: 36.4 % — ABNORMAL LOW (ref 39.0–52.0)
Hemoglobin: 12.5 g/dL — ABNORMAL LOW (ref 13.0–17.0)
MCH: 32.6 pg (ref 26.0–34.0)
MCHC: 34.3 g/dL (ref 30.0–36.0)
MCV: 95 fL (ref 80.0–100.0)
Platelets: 444 10*3/uL — ABNORMAL HIGH (ref 150–400)
RBC: 3.83 MIL/uL — ABNORMAL LOW (ref 4.22–5.81)
RDW: 14.6 % (ref 11.5–15.5)
WBC: 21.7 10*3/uL — ABNORMAL HIGH (ref 4.0–10.5)
nRBC: 0 % (ref 0.0–0.2)

## 2021-06-17 LAB — URINALYSIS, COMPLETE (UACMP) WITH MICROSCOPIC
Bacteria, UA: NONE SEEN
Bilirubin Urine: NEGATIVE
Glucose, UA: NEGATIVE mg/dL
Hgb urine dipstick: NEGATIVE
Ketones, ur: 5 mg/dL — AB
Leukocytes,Ua: NEGATIVE
Nitrite: NEGATIVE
Protein, ur: 30 mg/dL — AB
Specific Gravity, Urine: 1.019 (ref 1.005–1.030)
pH: 6 (ref 5.0–8.0)

## 2021-06-17 LAB — TROPONIN I (HIGH SENSITIVITY): Troponin I (High Sensitivity): 8 ng/L (ref <18)

## 2021-06-17 LAB — LIPASE, BLOOD: Lipase: 25 U/L (ref 11–51)

## 2021-06-17 MED ORDER — SODIUM CHLORIDE 0.9 % IV BOLUS
1000.0000 mL | Freq: Once | INTRAVENOUS | Status: AC
  Administered 2021-06-18: 1000 mL via INTRAVENOUS

## 2021-06-17 MED ORDER — MORPHINE SULFATE (PF) 4 MG/ML IV SOLN
4.0000 mg | Freq: Once | INTRAVENOUS | Status: AC
Start: 2021-06-17 — End: 2021-06-17
  Administered 2021-06-17: 4 mg via INTRAVENOUS
  Filled 2021-06-17: qty 1

## 2021-06-17 MED ORDER — IOHEXOL 350 MG/ML SOLN
75.0000 mL | Freq: Once | INTRAVENOUS | Status: AC | PRN
  Administered 2021-06-17: 75 mL via INTRAVENOUS

## 2021-06-17 MED ORDER — PROCHLORPERAZINE MALEATE 10 MG PO TABS
10.0000 mg | ORAL_TABLET | Freq: Four times a day (QID) | ORAL | Status: DC | PRN
  Filled 2021-06-17: qty 1

## 2021-06-17 MED ORDER — KETOROLAC TROMETHAMINE 30 MG/ML IJ SOLN: 30.0000 mg | Freq: Four times a day (QID) | INTRAMUSCULAR | Status: DC | PRN

## 2021-06-17 MED ORDER — PROCHLORPERAZINE EDISYLATE 10 MG/2ML IJ SOLN: 5.0000 mg | Freq: Four times a day (QID) | INTRAMUSCULAR | Status: DC | PRN

## 2021-06-17 MED ORDER — KCL IN DEXTROSE-NACL 20-5-0.9 MEQ/L-%-% IV SOLN
INTRAVENOUS | Status: DC
  Filled 2021-06-17 (×8): qty 1000

## 2021-06-17 MED ORDER — LORAZEPAM 2 MG/ML IJ SOLN
1.0000 mg | Freq: Once | INTRAMUSCULAR | Status: AC
  Administered 2021-06-18: 1 mg via INTRAVENOUS
  Filled 2021-06-17: qty 1

## 2021-06-17 MED ORDER — SODIUM CHLORIDE 0.9 % IV BOLUS
1000.0000 mL | Freq: Once | INTRAVENOUS | Status: AC
  Administered 2021-06-17: 1000 mL via INTRAVENOUS

## 2021-06-17 MED ORDER — HEPARIN SODIUM (PORCINE) 5000 UNIT/ML IJ SOLN
5000.0000 [IU] | Freq: Three times a day (TID) | INTRAMUSCULAR | Status: DC
  Administered 2021-06-18 – 2021-06-19 (×5): 5000 [IU] via SUBCUTANEOUS
  Filled 2021-06-17 (×4): qty 1

## 2021-06-17 MED ORDER — HYDRALAZINE HCL 20 MG/ML IJ SOLN
10.0000 mg | INTRAMUSCULAR | Status: DC | PRN
Start: 2021-06-17 — End: 2021-06-19

## 2021-06-17 MED ORDER — PANTOPRAZOLE SODIUM 40 MG IV SOLR
40.0000 mg | Freq: Every day | INTRAVENOUS | Status: DC
  Administered 2021-06-18 (×2): 40 mg via INTRAVENOUS
  Filled 2021-06-17 (×2): qty 40

## 2021-06-17 MED ORDER — ONDANSETRON HCL 4 MG/2ML IJ SOLN: 4.0000 mg | Freq: Four times a day (QID) | INTRAMUSCULAR | Status: DC | PRN

## 2021-06-17 MED ORDER — MORPHINE SULFATE (PF) 4 MG/ML IV SOLN
4.0000 mg | INTRAVENOUS | Status: DC | PRN
  Administered 2021-06-18: 4 mg via INTRAVENOUS
  Filled 2021-06-17: qty 1

## 2021-06-17 MED ORDER — ONDANSETRON HCL 4 MG/2ML IJ SOLN
4.0000 mg | Freq: Once | INTRAMUSCULAR | Status: AC
  Administered 2021-06-17: 4 mg via INTRAVENOUS
  Filled 2021-06-17: qty 2

## 2021-06-17 MED ORDER — ONDANSETRON 4 MG PO TBDP: 4.0000 mg | ORAL_TABLET | Freq: Four times a day (QID) | ORAL | Status: DC | PRN

## 2021-06-17 NOTE — ED Notes (Signed)
Patient is actively vomiting. MD aware, orders placed

## 2021-06-17 NOTE — Progress Notes (Signed)
55 yo male comes in w SBO transition point RLQ likely adhesion. He has recurrent episode and CT images identical to 3 years ago. We will admit, start crystalloid resuscitation and NG decompression. No evidence of bowel ischemia, pneumatosis or free air on CT and no evidence of peritonitis per Dr. Conni Elliot.  We may require surgical intervention at some point in time given recurrence of obstruction and exact same transition point.Full consult to follow in am

## 2021-06-17 NOTE — ED Notes (Signed)
Patient return from CT

## 2021-06-17 NOTE — ED Notes (Signed)
Pt unable to void at this time. 

## 2021-06-17 NOTE — ED Notes (Signed)
Patient to CT via stretcher.

## 2021-06-17 NOTE — ED Triage Notes (Signed)
Pt has upper abd pain since this am.   No v/d.  Pt took maalox without relief.  Pt alert.

## 2021-06-17 NOTE — ED Notes (Signed)
MD at the bedside  
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
## 2021-06-18 ENCOUNTER — Inpatient Hospital Stay: Payer: BC Managed Care – PPO

## 2021-06-18 DIAGNOSIS — K56609 Unspecified intestinal obstruction, unspecified as to partial versus complete obstruction: Principal | ICD-10-CM

## 2021-06-18 LAB — COMPREHENSIVE METABOLIC PANEL
ALT: 20 U/L (ref 0–44)
AST: 38 U/L (ref 15–41)
Albumin: 2.5 g/dL — ABNORMAL LOW (ref 3.5–5.0)
Alkaline Phosphatase: 127 U/L — ABNORMAL HIGH (ref 38–126)
Anion gap: 9 (ref 5–15)
BUN: 8 mg/dL (ref 6–20)
CO2: 24 mmol/L (ref 22–32)
Calcium: 7.9 mg/dL — ABNORMAL LOW (ref 8.9–10.3)
Chloride: 101 mmol/L (ref 98–111)
Creatinine, Ser: 0.85 mg/dL (ref 0.61–1.24)
GFR, Estimated: >60 mL/min (ref >60)
Glucose, Bld: 122 mg/dL — ABNORMAL HIGH (ref 70–99)
Potassium: 4 mmol/L (ref 3.5–5.1)
Sodium: 134 mmol/L — ABNORMAL LOW (ref 135–145)
Total Bilirubin: 0.9 mg/dL (ref 0.3–1.2)
Total Protein: 6.5 g/dL (ref 6.5–8.1)

## 2021-06-18 LAB — CBC
HCT: 28.2 % — ABNORMAL LOW (ref 39.0–52.0)
Hemoglobin: 9.6 g/dL — ABNORMAL LOW (ref 13.0–17.0)
MCH: 32.5 pg (ref 26.0–34.0)
MCHC: 34 g/dL (ref 30.0–36.0)
MCV: 95.6 fL (ref 80.0–100.0)
Platelets: 313 10*3/uL (ref 150–400)
RBC: 2.95 MIL/uL — ABNORMAL LOW (ref 4.22–5.81)
RDW: 14.6 % (ref 11.5–15.5)
WBC: 13.1 10*3/uL — ABNORMAL HIGH (ref 4.0–10.5)
nRBC: 0 % (ref 0.0–0.2)

## 2021-06-18 LAB — HIV ANTIBODY (ROUTINE TESTING W REFLEX): HIV Screen 4th Generation wRfx: NONREACTIVE

## 2021-06-18 LAB — MAGNESIUM: Magnesium: 1.7 mg/dL (ref 1.7–2.4)

## 2021-06-18 LAB — RESP PANEL BY RT-PCR (FLU A&B, COVID) ARPGX2
Influenza A by PCR: NEGATIVE
Influenza B by PCR: NEGATIVE
SARS Coronavirus 2 by RT PCR: POSITIVE — AB

## 2021-06-18 LAB — PHOSPHORUS: Phosphorus: 3.5 mg/dL (ref 2.5–4.6)

## 2021-06-18 NOTE — ED Notes (Signed)
Patient transported to 227 by RN via stretcher, stable at transfer.

## 2021-06-18 NOTE — ED Notes (Signed)
Patient is refusing NG tube. Explained rationale of why it needs to be placed. Pt states "he does not care" and "is not messing with the tube". Admitting MD notified and aware.
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
## 2021-06-19 ENCOUNTER — Inpatient Hospital Stay: Payer: BC Managed Care – PPO

## 2021-06-19 LAB — CBC
HCT: 29.3 % — ABNORMAL LOW (ref 39.0–52.0)
Hemoglobin: 9.9 g/dL — ABNORMAL LOW (ref 13.0–17.0)
MCH: 33.2 pg (ref 26.0–34.0)
MCHC: 33.8 g/dL (ref 30.0–36.0)
MCV: 98.3 fL (ref 80.0–100.0)
Platelets: 300 10*3/uL (ref 150–400)
RBC: 2.98 MIL/uL — ABNORMAL LOW (ref 4.22–5.81)
RDW: 14.5 % (ref 11.5–15.5)
WBC: 8.3 10*3/uL (ref 4.0–10.5)
nRBC: 0 % (ref 0.0–0.2)

## 2021-06-19 LAB — BASIC METABOLIC PANEL
Anion gap: 4 — ABNORMAL LOW (ref 5–15)
BUN: <5 mg/dL — ABNORMAL LOW (ref 6–20)
CO2: 25 mmol/L (ref 22–32)
Calcium: 7.9 mg/dL — ABNORMAL LOW (ref 8.9–10.3)
Chloride: 107 mmol/L (ref 98–111)
Creatinine, Ser: 0.63 mg/dL (ref 0.61–1.24)
GFR, Estimated: >60 mL/min (ref >60)
Glucose, Bld: 97 mg/dL (ref 70–99)
Potassium: 3.5 mmol/L (ref 3.5–5.1)
Sodium: 136 mmol/L (ref 135–145)
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
## 2021-08-12 ENCOUNTER — Emergency Department
Admission: EM | Admit: 2021-08-12 | Discharge: 2021-08-12 | Disposition: A | Discharge status: Home / Self Care | Payer: BC Managed Care – PPO | Attending: Emergency Medicine | Admitting: Emergency Medicine

## 2021-08-12 ENCOUNTER — Other Ambulatory Visit: Payer: Self-pay

## 2021-08-12 DIAGNOSIS — M7989 Other specified soft tissue disorders: Secondary | ICD-10-CM | POA: Diagnosis not present

## 2021-08-12 DIAGNOSIS — F17210 Nicotine dependence, cigarettes, uncomplicated: Secondary | ICD-10-CM | POA: Insufficient documentation

## 2021-08-12 LAB — COMPREHENSIVE METABOLIC PANEL
ALT: 30 U/L (ref 0–44)
AST: 88 U/L — ABNORMAL HIGH (ref 15–41)
Albumin: 3.6 g/dL (ref 3.5–5.0)
Alkaline Phosphatase: 119 U/L (ref 38–126)
Anion gap: 7 (ref 5–15)
BUN: 5 mg/dL — ABNORMAL LOW (ref 6–20)
CO2: 29 mmol/L (ref 22–32)
Calcium: 8.9 mg/dL (ref 8.9–10.3)
Chloride: 100 mmol/L (ref 98–111)
Creatinine, Ser: 0.8 mg/dL (ref 0.61–1.24)
GFR, Estimated: >60 mL/min (ref >60)
Glucose, Bld: 96 mg/dL (ref 70–99)
Potassium: 3.4 mmol/L — ABNORMAL LOW (ref 3.5–5.1)
Sodium: 136 mmol/L (ref 135–145)
Total Bilirubin: 1.1 mg/dL (ref 0.3–1.2)
Total Protein: 7.8 g/dL (ref 6.5–8.1)

## 2021-08-12 NOTE — ED Triage Notes (Signed)
Pt come with c/o bilateral foot swelling since last week. Pt states no hx of this.
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
## 2021-08-12 NOTE — Discharge Instructions (Signed)
Your blood test was normal today.  Your liver function and kidney function are normal.  Please try elevating your legs while you are sitting to help with the swelling.  Please follow-up with [facility] for further management.

## 2021-08-12 NOTE — ED Triage Notes (Signed)
First Nurse Note:  C/O bilateral foot swelling x 5 days.  AAOx3.  Skin warm and dry. NAD.  No SOB/ DOE
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
## 2022-04-23 DEATH — deceased
